# Patient Record
Sex: Male | Born: 1935
Health system: Southern US, Community
[De-identification: ages and names within clinical notes are randomized; demographics above are authoritative.]

## PROBLEM LIST (undated history)

## (undated) DIAGNOSIS — C61 Malignant neoplasm of prostate: Secondary | ICD-10-CM

## (undated) HISTORY — PX: APPENDECTOMY: SHX54

## (undated) HISTORY — PX: HERNIA REPAIR: SHX51

## (undated) HISTORY — PX: RADIOACTIVE SEED IMPLANT: SHX5150

## (undated) HISTORY — PX: CORONARY ARTERY BYPASS GRAFT: SHX141

---

## 2006-11-25 ENCOUNTER — Encounter: Admission: RE | Admit: 2006-11-25 | Discharge: 2006-12-02 | Payer: Self-pay | Admitting: Sports Medicine

## 2007-09-06 ENCOUNTER — Emergency Department (HOSPITAL_COMMUNITY): Admission: EM | Admit: 2007-09-06 | Discharge: 2007-09-06 | Payer: Self-pay | Admitting: Family Medicine

## 2007-12-01 ENCOUNTER — Emergency Department (HOSPITAL_COMMUNITY): Admission: EM | Admit: 2007-12-01 | Discharge: 2007-12-01 | Payer: Self-pay | Admitting: Emergency Medicine

## 2008-04-16 ENCOUNTER — Emergency Department (HOSPITAL_COMMUNITY): Admission: EM | Admit: 2008-04-16 | Discharge: 2008-04-16 | Payer: Self-pay | Admitting: Emergency Medicine

## 2011-05-31 ENCOUNTER — Encounter (HOSPITAL_COMMUNITY): Payer: Self-pay | Admitting: Emergency Medicine

## 2011-05-31 ENCOUNTER — Emergency Department (HOSPITAL_COMMUNITY)
Admission: EM | Admit: 2011-05-31 | Discharge: 2011-05-31 | Disposition: A | Discharge status: Home / Self Care | Payer: Medicare Other | Attending: Emergency Medicine | Admitting: Emergency Medicine

## 2011-05-31 DIAGNOSIS — Z8546 Personal history of malignant neoplasm of prostate: Secondary | ICD-10-CM | POA: Insufficient documentation

## 2011-05-31 DIAGNOSIS — K5289 Other specified noninfective gastroenteritis and colitis: Secondary | ICD-10-CM | POA: Insufficient documentation

## 2011-05-31 DIAGNOSIS — K529 Noninfective gastroenteritis and colitis, unspecified: Secondary | ICD-10-CM

## 2011-05-31 HISTORY — DX: Malignant neoplasm of prostate: C61

## 2011-05-31 LAB — COMPREHENSIVE METABOLIC PANEL
Albumin: 5.1 g/dL (ref 3.5–5.2)
Alkaline Phosphatase: 74 U/L (ref 39–117)
BUN: 18 mg/dL (ref 6–23)
CO2: 29 mEq/L (ref 19–32)
Chloride: 96 mEq/L (ref 96–112)
GFR calc Af Amer: 58 mL/min — ABNORMAL LOW (ref >90)
GFR calc non Af Amer: 50 mL/min — ABNORMAL LOW (ref >90)
Glucose, Bld: 168 mg/dL — ABNORMAL HIGH (ref 70–99)
Potassium: 4.4 mEq/L (ref 3.5–5.1)
Total Bilirubin: 0.9 mg/dL (ref 0.3–1.2)

## 2011-05-31 LAB — LIPASE, BLOOD: Lipase: 48 U/L (ref 11–59)

## 2011-05-31 LAB — DIFFERENTIAL
Lymphocytes Relative: 2 % — ABNORMAL LOW (ref 12–46)
Lymphs Abs: 0.3 10*3/uL — ABNORMAL LOW (ref 0.7–4.0)
Monocytes Absolute: 0.7 10*3/uL (ref 0.1–1.0)
Monocytes Relative: 4 % (ref 3–12)
Neutro Abs: 14.7 10*3/uL — ABNORMAL HIGH (ref 1.7–7.7)
Neutrophils Relative %: 93 % — ABNORMAL HIGH (ref 43–77)

## 2011-05-31 LAB — CBC
HCT: 51.3 % (ref 39.0–52.0)
Hemoglobin: 18.2 g/dL — ABNORMAL HIGH (ref 13.0–17.0)
RBC: 5.65 MIL/uL (ref 4.22–5.81)

## 2011-05-31 MED ORDER — ONDANSETRON 8 MG PO TBDP: 8.0000 mg | ORAL_TABLET | Freq: Three times a day (TID) | ORAL | Status: AC | PRN

## 2011-05-31 MED ORDER — SODIUM CHLORIDE 0.9 % IV BOLUS (SEPSIS)
1000.0000 mL | Freq: Once | INTRAVENOUS | Status: AC
  Administered 2011-05-31: 1000 mL via INTRAVENOUS

## 2011-05-31 MED ORDER — ONDANSETRON HCL 4 MG/2ML IJ SOLN
4.0000 mg | Freq: Once | INTRAMUSCULAR | Status: AC
  Administered 2011-05-31: 4 mg via INTRAVENOUS
  Filled 2011-05-31: qty 2

## 2011-05-31 MED ORDER — MORPHINE SULFATE 2 MG/ML IJ SOLN
2.0000 mg | Freq: Once | INTRAMUSCULAR | Status: AC
  Administered 2011-05-31: 2 mg via INTRAVENOUS
  Filled 2011-05-31: qty 1

## 2011-05-31 MED ORDER — DIPHENOXYLATE-ATROPINE 2.5-0.025 MG PO TABS
2.0000 | ORAL_TABLET | Freq: Once | ORAL | Status: AC
  Administered 2011-05-31: 2 via ORAL
  Filled 2011-05-31: qty 2

## 2011-05-31 MED ORDER — SODIUM CHLORIDE 0.9 % IV SOLN
Freq: Once | INTRAVENOUS | Status: AC
  Administered 2011-05-31: 05:00:00 via INTRAVENOUS

## 2011-05-31 MED ORDER — DIPHENOXYLATE-ATROPINE 2.5-0.025 MG PO TABS: 1.0000 | ORAL_TABLET | Freq: Four times a day (QID) | ORAL | Status: AC | PRN

## 2011-05-31 NOTE — ED Provider Notes (Addendum)
History     CSN: [id number]  Arrival date & time 05/31/11  0350   First MD Initiated Contact with Patient 05/31/11 0410      Chief Complaint  Patient presents with  . N/V/D     (Consider location/radiation/quality/duration/timing/severity/associated sxs/prior treatment) The history is provided by the patient.   Patient here with nonbilious vomiting and watery diarrhea after possible bad food exposure. Abdominal cramping notable chills. No fever or urinary symptoms. Patient denies being lightheaded or dizzy when standing. Nothing makes her symptoms better or worse. No medications used prior to arrival Past Medical History  Diagnosis Date  . Prostate cancer     Past Surgical History  Procedure Date  . Appendectomy   . Hernia repair     History reviewed. No pertinent family history.  History  Substance Use Topics  . Smoking status: Never Smoker   . Smokeless tobacco: Not on file  . Alcohol Use: Yes      Review of Systems  All other systems reviewed and are negative.    Allergies  Penicillins  Home Medications  No current outpatient prescriptions on file.  BP 142/72  Pulse 92  Resp 16  Wt 170 lb (77.111 kg)  SpO2 98%  Physical Exam  Nursing note and vitals reviewed. Constitutional: He is oriented to person, place, and time. Vital signs are normal. He appears well-developed and well-nourished.  Non-toxic appearance. No distress.  HENT:  Head: Normocephalic and atraumatic.  Eyes: Conjunctivae, EOM and lids are normal. Pupils are equal, round, and reactive to light.  Neck: Normal range of motion. Neck supple. No tracheal deviation present. No mass present.  Cardiovascular: Normal rate, regular rhythm and normal heart sounds.  Exam reveals no gallop.   No murmur heard. Pulmonary/Chest: Effort normal and breath sounds normal. No stridor. No respiratory distress. He has no decreased breath sounds. He has no wheezes. He has no rhonchi. He has no rales.    Abdominal: Soft. Normal appearance and bowel sounds are normal. He exhibits no distension. There is no tenderness. There is no rebound and no CVA tenderness.  Musculoskeletal: Normal range of motion. He exhibits no edema and no tenderness.  Neurological: He is alert and oriented to person, place, and time. He has normal strength. No cranial nerve deficit or sensory deficit. GCS eye subscore is 4. GCS verbal subscore is 5. GCS motor subscore is 6.  Skin: Skin is warm and dry. No abrasion and no rash noted.  Psychiatric: He has a normal mood and affect. His speech is normal and behavior is normal.    ED Course  Procedures (including critical care time)  Labs Reviewed - No data to display No results found.   No diagnosis found.    MDM  Patient given IV fluids and pain medication. He feels better 5:48 AM Repeat abdominal exam remains negative for surgical process. Suspect the patient has food poisoning he'll be discharged home        Toy Baker, MD 05/31/11 4098  Toy Baker, MD 06/04/11 [phone]

## 2011-05-31 NOTE — Discharge Instructions (Signed)
Viral Gastroenteritis Viral gastroenteritis is also known as stomach flu. This condition affects the stomach and intestinal tract. It can cause sudden diarrhea and vomiting. The illness typically lasts 3 to 8 days. Most people develop an immune response that eventually gets rid of the virus. While this natural response develops, the virus can make you quite ill. CAUSES  Many different viruses can cause gastroenteritis, such as rotavirus or noroviruses. You can catch one of these viruses by consuming contaminated food or water. You may also catch a virus by sharing utensils or other personal items with an infected person or by touching a contaminated surface. SYMPTOMS  The most common symptoms are diarrhea and vomiting. These problems can cause a severe loss of body fluids (dehydration) and a body salt (electrolyte) imbalance. Other symptoms may include:  Fever.   Headache.   Fatigue.   Abdominal pain.  DIAGNOSIS  Your caregiver can usually diagnose viral gastroenteritis based on your symptoms and a physical exam. A stool sample may also be taken to test for the presence of viruses or other infections. TREATMENT  This illness typically goes away on its own. Treatments are aimed at rehydration. The most serious cases of viral gastroenteritis involve vomiting so severely that you are not able to keep fluids down. In these cases, fluids must be given through an intravenous line (IV). HOME CARE INSTRUCTIONS   Drink enough fluids to keep your urine clear or pale yellow. Drink small amounts of fluids frequently and increase the amounts as tolerated.   Ask your caregiver for specific rehydration instructions.   Avoid:   Foods high in sugar.   Alcohol.   Carbonated drinks.   Tobacco.   Juice.   Caffeine drinks.   Extremely hot or cold fluids.   Fatty, greasy foods.   Too much intake of anything at one time.   Dairy products until 24 to 48 hours after diarrhea stops.   You may  consume probiotics. Probiotics are active cultures of beneficial bacteria. They may lessen the amount and number of diarrheal stools in adults. Probiotics can be found in yogurt with active cultures and in supplements.   Wash your hands well to avoid spreading the virus.   Only take over-the-counter or prescription medicines for pain, discomfort, or fever as directed by your caregiver. Do not give aspirin to children. Antidiarrheal medicines are not recommended.   Ask your caregiver if you should continue to take your regular prescribed and over-the-counter medicines.   Keep all follow-up appointments as directed by your caregiver.  SEEK IMMEDIATE MEDICAL CARE IF:   You are unable to keep fluids down.   You do not urinate at least once every 6 to 8 hours.   You develop shortness of breath.   You notice blood in your stool or vomit. This may look like coffee grounds.   You have abdominal pain that increases or is concentrated in one small area (localized).   You have persistent vomiting or diarrhea.   You have a fever.   The patient is a child younger than 3 months, and he or she has a fever.   The patient is a child older than 3 months, and he or she has a fever and persistent symptoms.   The patient is a child older than 3 months, and he or she has a fever and symptoms suddenly get worse.   The patient is a baby, and he or she has no tears when crying.  MAKE SURE YOU:  Understand these instructions.   Will watch your condition.   Will get help right away if you are not doing well or get worse.  Document Released: 02/11/2005 Document Revised: 01/31/2011 Document Reviewed: 11/28/2010 [facility] Patient Information 2012 [place], [place].

## 2011-05-31 NOTE — ED Notes (Signed)
Pt presented to the ER c/o N/V/D, states that sx started this evening about 2230, x4-5 vomiting and x3-4 diarrhea. Pt also reporst some abd pain and discomfort. Pt alert and oriented.

## 2011-05-31 NOTE — ED Notes (Signed)
Pt states he ate spaghetti for dinner and has been sick since.

## 2011-06-27 ENCOUNTER — Encounter (HOSPITAL_COMMUNITY): Payer: Self-pay | Admitting: Emergency Medicine

## 2011-06-27 ENCOUNTER — Emergency Department (HOSPITAL_COMMUNITY)
Admission: EM | Admit: 2011-06-27 | Discharge: 2011-06-28 | Disposition: A | Discharge status: Home / Self Care | Payer: Medicare Other | Attending: Emergency Medicine | Admitting: Emergency Medicine

## 2011-06-27 DIAGNOSIS — R5381 Other malaise: Secondary | ICD-10-CM | POA: Insufficient documentation

## 2011-06-27 DIAGNOSIS — R5383 Other fatigue: Secondary | ICD-10-CM | POA: Insufficient documentation

## 2011-06-27 DIAGNOSIS — S90569A Insect bite (nonvenomous), unspecified ankle, initial encounter: Secondary | ICD-10-CM | POA: Insufficient documentation

## 2011-06-27 DIAGNOSIS — W57XXXA Bitten or stung by nonvenomous insect and other nonvenomous arthropods, initial encounter: Secondary | ICD-10-CM

## 2011-06-27 NOTE — ED Provider Notes (Signed)
History     CSN: [id number]  Arrival date & time 06/27/11  2145   First MD Initiated Contact with Patient 06/27/11 2332      Chief Complaint  Patient presents with  . Foreign Body in Skin    (Consider location/radiation/quality/duration/timing/severity/associated sxs/prior treatment) HPI  77 year old male presents with chief complaints of tick bite. States he is [place] in the woods all the time.  He felt tired today and has slept most of the day.  When he went to shower this PM he notice a tick on his L anterior shin.  He did remove it and put it in a bag but felt that he may have not removed the tick entirely.  He denies significant pain, fever, headache, or rash.  Denies n/v/d.    Past Medical History  Diagnosis Date  . Prostate cancer     Past Surgical History  Procedure Date  . Appendectomy   . Hernia repair     History reviewed. No pertinent family history.  History  Substance Use Topics  . Smoking status: Never Smoker   . Smokeless tobacco: Not on file  . Alcohol Use: Yes      Review of Systems  Constitutional: Positive for fatigue. Negative for fever.  Musculoskeletal: Negative for joint swelling.  Skin: Negative for rash.    Allergies  Penicillins and Amoxicillin sodium  Home Medications   Current Outpatient Rx  Name Route Sig Dispense Refill  . STUDY MEDICATION  [facility] blind study. Patient could be taking 83 mg aspirin or placebo.      BP 125/55  Pulse 68  Temp(Src) 97.4 F (36.3 C) (Oral)  Resp 16  SpO2 98%  Physical Exam  Nursing note and vitals reviewed. Constitutional: He appears well-developed and well-nourished.  HENT:  Head: Atraumatic.  Eyes: Conjunctivae are normal.  Neck: Neck supple.  Pulmonary/Chest: Effort normal.  Musculoskeletal: Normal range of motion. He exhibits no edema and no tenderness.  Neurological: He is alert.  Skin: Skin is warm.       2mm skin ulceration noted to mid anterior tibia on L leg, no fb seen or  palpated.  No rash.      ED Course  Procedures (including critical care time)  Labs Reviewed - No data to display No results found.   No diagnosis found.    MDM  Tick bite to L mid anterior shin.  Tick removed. No retained body parts. No rash.  Unknown how long tick was on skin.  Full inspection of the skin reveals no other tick.  Reassurance given.  Will prescribe doxycycline for pt to use if develop fever, headache, or rash.  Pt voice understanding.          Fayrene Helper, PA-C 06/28/11 0028

## 2011-06-27 NOTE — ED Notes (Signed)
Patient states that he pulled a tick out of his leg. Sample with him in baggie

## 2011-06-28 MED ORDER — DOXYCYCLINE HYCLATE 100 MG PO CAPS: 100.0000 mg | ORAL_CAPSULE | Freq: Two times a day (BID) | ORAL | Status: AC

## 2011-06-28 NOTE — Discharge Instructions (Signed)
Wood Tick Bite Ticks are insects that attach themselves to the skin. Most tick bites are harmless, but sometimes ticks carry diseases that can make a person quite ill. The chance of getting ill depends on:  The kind of tick that bites you.   Time of year.   How long the tick is attached.   Geographic location.  Wood ticks are also called dog ticks. They are generally black. They can have white markings. They live in shrubs and grassy areas. They are larger than deer ticks. Wood ticks are about the size of a watermelon seed. They have a hard body. The most common places for ticks to attach themselves are the scalp, neck, armpits, waist, and groin. Wood ticks may stay attached for up to 2 weeks. TICKS MUST BE REMOVED AS SOON AS POSSIBLE TO HELP PREVENT DISEASES CAUSED BY TICK BITES.  TO REMOVE A TICK: 1. If available, put on latex gloves before trying to remove a tick.  2. Grasp the tick as close to the skin as possible, with curved forceps, fine tweezers or a special tick removal tool.  3. Pull gently with steady pressure until the tick lets go. Do not twist the tick or jerk it suddenly. This may break off the tick's head or mouth parts.  4. Do not crush the tick's body. This could force disease-carrying fluids from the tick into your body.  5. After the tick is removed, wash the bite area and your hands with soap and water or other disinfectant.  6. Apply a small amount of antiseptic cream or ointment to the bite site.  7. Wash and disinfect any instruments that were used.  8. Save the tick in a jar or plastic bag for later identification. Preserve the tick with a bit of alcohol or put it in the freezer.  9. Do not apply a hot match, petroleum jelly, or fingernail polish to the tick. This does not work and may increase the chances of disease from the tick bite.  YOU MAY NEED TO SEE YOUR CAREGIVER FOR A TETANUS SHOT NOW IF:  You have no idea when you had the last one.   You have never had a  tetanus shot before.  If you need a tetanus shot, and you decide not to get one, there is a rare chance of getting tetanus. Sickness from tetanus can be serious. If you get a tetanus shot, your arm may swell, get red and warm to the touch at the shot site. This is common and not a problem. PREVENTION  Wear protective clothing. Long sleeves and pants are best.   Wear white clothes to see ticks more easily   Tuck your pant legs into your socks.   If walking on trail, stay in the middle of the trail to avoid brushing against bushes.   Put insect repellent on all exposed skin and along boot tops, pant legs and sleeve cuffs   Check clothing, hair and skin repeatedly and before coming inside.   Brush off any ticks that are not attached.  SEEK MEDICAL CARE IF:   You cannot remove a tick or part of the tick that is left in the skin.   Unexplained fever.   Redness and swelling in the area of the tick bite.   Tender, swollen lymph glands.   Diarrhea.   Weight loss.   Cough.   Fatigue.   Muscle, joint or bone pain.   Belly pain.   Headache.   Rash.    SEEK IMMEDIATE MEDICAL CARE IF:   You develop an oral temperature above 102 F (38.9 C).   You are having trouble walking or moving your legs.   Numbness in the legs.   Shortness of breath.   Confusion.   Repeated vomiting.  Document Released: 02/09/2000 Document Revised: 01/31/2011 Document Reviewed: 01/18/2008 [employer] Patient Information 2012 [employer]. 

## 2011-06-28 NOTE — ED Provider Notes (Signed)
Medical screening examination/treatment/procedure(s) were performed by non-physician practitioner and as supervising physician I was immediately available for consultation/collaboration.  Olivia Mackie, MD 06/28/11 [phone]

## 2011-06-28 NOTE — ED Notes (Signed)
Patient ambulatory with a steady gait. Respirations equal and unlabored. Skin warm and dry. No acute distress noted. 

## 2012-05-21 ENCOUNTER — Encounter (HOSPITAL_COMMUNITY)
Admission: RE | Admit: 2012-05-21 | Discharge: 2012-05-21 | Disposition: A | Discharge status: Home / Self Care | Payer: Medicare Other | Source: Ambulatory Visit | Attending: Cardiology | Admitting: Cardiology

## 2012-05-21 DIAGNOSIS — Z5189 Encounter for other specified aftercare: Secondary | ICD-10-CM | POA: Insufficient documentation

## 2012-05-21 DIAGNOSIS — Z951 Presence of aortocoronary bypass graft: Secondary | ICD-10-CM | POA: Insufficient documentation

## 2012-05-21 NOTE — Progress Notes (Signed)
Cardiac Rehab Medication Review by a Pharmacist  Does the patient  feel that his/her medications are working for him/her?  yes  Has the patient been experiencing any side effects to the medications prescribed?  no  Does the patient measure his/her own blood pressure or blood glucose at home?  yes   Does the patient have any problems obtaining medications due to transportation or finances?   no  Understanding of regimen: excellent Understanding of indications: excellent Potential of compliance: excellent    Pharmacist comments: none    Laurence Slate 05/21/2012 8:36 AM

## 2012-05-25 ENCOUNTER — Encounter (HOSPITAL_COMMUNITY)
Admission: RE | Admit: 2012-05-25 | Discharge: 2012-05-25 | Disposition: A | Discharge status: Home / Self Care | Payer: Medicare Other | Source: Ambulatory Visit | Attending: Cardiology | Admitting: Cardiology

## 2012-05-25 NOTE — Progress Notes (Signed)
Pt in for his first cardiac rehab exercise class at 11:15 a.m  Pt tolerated light exercise with no complaints.  Monitor showed SR with no ectopy noted.  Continue to montior.

## 2012-05-27 ENCOUNTER — Encounter (HOSPITAL_COMMUNITY): Admission: RE | Admit: 2012-05-27 | Payer: Medicare Other | Source: Ambulatory Visit

## 2012-05-29 ENCOUNTER — Encounter (HOSPITAL_COMMUNITY): Payer: Medicare Other

## 2012-06-01 ENCOUNTER — Encounter (HOSPITAL_COMMUNITY): Payer: Medicare Other

## 2012-06-03 ENCOUNTER — Encounter (HOSPITAL_COMMUNITY): Payer: Medicare Other

## 2012-06-05 ENCOUNTER — Encounter (HOSPITAL_COMMUNITY): Payer: Medicare Other

## 2012-06-08 ENCOUNTER — Encounter (HOSPITAL_COMMUNITY): Payer: Medicare Other

## 2012-06-10 ENCOUNTER — Encounter (HOSPITAL_COMMUNITY): Payer: Medicare Other

## 2012-06-12 ENCOUNTER — Encounter (HOSPITAL_COMMUNITY): Payer: Medicare Other

## 2012-06-15 ENCOUNTER — Encounter (HOSPITAL_COMMUNITY): Payer: Medicare Other

## 2012-06-17 ENCOUNTER — Encounter (HOSPITAL_COMMUNITY): Payer: Medicare Other

## 2012-06-19 ENCOUNTER — Encounter (HOSPITAL_COMMUNITY): Payer: Medicare Other

## 2012-06-22 ENCOUNTER — Encounter (HOSPITAL_COMMUNITY): Payer: Medicare Other

## 2012-06-24 ENCOUNTER — Encounter (HOSPITAL_COMMUNITY): Payer: Medicare Other

## 2012-06-26 ENCOUNTER — Encounter (HOSPITAL_COMMUNITY): Payer: Medicare Other

## 2012-06-29 ENCOUNTER — Encounter (HOSPITAL_COMMUNITY): Payer: Medicare Other

## 2012-07-01 ENCOUNTER — Encounter (HOSPITAL_COMMUNITY): Payer: Medicare Other

## 2012-07-03 ENCOUNTER — Encounter (HOSPITAL_COMMUNITY): Payer: Medicare Other

## 2012-07-06 ENCOUNTER — Encounter (HOSPITAL_COMMUNITY): Payer: Medicare Other

## 2012-07-08 ENCOUNTER — Encounter (HOSPITAL_COMMUNITY): Payer: Medicare Other

## 2012-07-10 ENCOUNTER — Encounter (HOSPITAL_COMMUNITY): Payer: Medicare Other

## 2012-07-13 ENCOUNTER — Encounter (HOSPITAL_COMMUNITY): Payer: Medicare Other

## 2012-07-15 ENCOUNTER — Encounter (HOSPITAL_COMMUNITY): Payer: Medicare Other

## 2012-07-17 ENCOUNTER — Encounter (HOSPITAL_COMMUNITY): Payer: Medicare Other

## 2012-07-20 ENCOUNTER — Encounter (HOSPITAL_COMMUNITY): Payer: Medicare Other

## 2012-07-22 ENCOUNTER — Encounter (HOSPITAL_COMMUNITY): Payer: Medicare Other

## 2012-07-24 ENCOUNTER — Encounter (HOSPITAL_COMMUNITY): Payer: Medicare Other

## 2012-07-27 ENCOUNTER — Encounter (HOSPITAL_COMMUNITY): Payer: Medicare Other

## 2012-07-29 ENCOUNTER — Encounter (HOSPITAL_COMMUNITY): Payer: Medicare Other

## 2012-07-31 ENCOUNTER — Encounter (HOSPITAL_COMMUNITY): Payer: Medicare Other

## 2012-08-03 ENCOUNTER — Encounter (HOSPITAL_COMMUNITY): Payer: Medicare Other

## 2012-08-05 ENCOUNTER — Encounter (HOSPITAL_COMMUNITY): Payer: Medicare Other

## 2012-08-07 ENCOUNTER — Encounter (HOSPITAL_COMMUNITY): Payer: Medicare Other

## 2012-08-10 ENCOUNTER — Encounter (HOSPITAL_COMMUNITY): Payer: Medicare Other

## 2012-08-12 ENCOUNTER — Encounter (HOSPITAL_COMMUNITY): Payer: Medicare Other

## 2012-08-14 ENCOUNTER — Encounter (HOSPITAL_COMMUNITY): Payer: Medicare Other

## 2012-08-17 ENCOUNTER — Encounter (HOSPITAL_COMMUNITY): Payer: Medicare Other

## 2012-08-19 ENCOUNTER — Encounter (HOSPITAL_COMMUNITY): Payer: Medicare Other

## 2012-08-21 ENCOUNTER — Encounter (HOSPITAL_COMMUNITY): Payer: Medicare Other

## 2012-08-24 ENCOUNTER — Encounter (HOSPITAL_COMMUNITY): Payer: Medicare Other

## 2012-08-26 ENCOUNTER — Encounter (HOSPITAL_COMMUNITY): Payer: Medicare Other

## 2012-08-28 ENCOUNTER — Encounter (HOSPITAL_COMMUNITY): Payer: Medicare Other

## 2014-11-01 ENCOUNTER — Other Ambulatory Visit: Payer: Self-pay | Admitting: Student

## 2014-11-01 DIAGNOSIS — Z139 Encounter for screening, unspecified: Secondary | ICD-10-CM

## 2014-11-04 ENCOUNTER — Ambulatory Visit
Admission: RE | Admit: 2014-11-04 | Discharge: 2014-11-04 | Disposition: A | Discharge status: Home / Self Care | Payer: Non-veteran care | Source: Ambulatory Visit | Attending: Student | Admitting: Student

## 2014-11-04 DIAGNOSIS — Z139 Encounter for screening, unspecified: Secondary | ICD-10-CM

## 2019-06-26 HISTORY — DX: Other disorders of bilirubin metabolism: E80.6

## 2019-06-28 ENCOUNTER — Other Ambulatory Visit: Payer: Self-pay

## 2019-06-28 ENCOUNTER — Emergency Department (HOSPITAL_COMMUNITY): Payer: No Typology Code available for payment source

## 2019-06-28 ENCOUNTER — Observation Stay (HOSPITAL_COMMUNITY): Payer: No Typology Code available for payment source

## 2019-06-28 ENCOUNTER — Encounter (HOSPITAL_COMMUNITY): Payer: Self-pay

## 2019-06-28 ENCOUNTER — Inpatient Hospital Stay (HOSPITAL_COMMUNITY)
Admission: EM | Admit: 2019-06-28 | Discharge: 2019-07-02 | DRG: 436 | Disposition: A | Discharge status: Home / Self Care | Payer: No Typology Code available for payment source | Attending: Internal Medicine | Admitting: Internal Medicine

## 2019-06-28 DIAGNOSIS — Z923 Personal history of irradiation: Secondary | ICD-10-CM

## 2019-06-28 DIAGNOSIS — R7989 Other specified abnormal findings of blood chemistry: Secondary | ICD-10-CM

## 2019-06-28 DIAGNOSIS — Z86010 Personal history of colonic polyps: Secondary | ICD-10-CM

## 2019-06-28 DIAGNOSIS — R739 Hyperglycemia, unspecified: Secondary | ICD-10-CM | POA: Diagnosis present

## 2019-06-28 DIAGNOSIS — R911 Solitary pulmonary nodule: Secondary | ICD-10-CM | POA: Diagnosis present

## 2019-06-28 DIAGNOSIS — Z951 Presence of aortocoronary bypass graft: Secondary | ICD-10-CM

## 2019-06-28 DIAGNOSIS — Z20822 Contact with and (suspected) exposure to covid-19: Secondary | ICD-10-CM | POA: Diagnosis present

## 2019-06-28 DIAGNOSIS — C787 Secondary malignant neoplasm of liver and intrahepatic bile duct: Principal | ICD-10-CM | POA: Diagnosis present

## 2019-06-28 DIAGNOSIS — K769 Liver disease, unspecified: Secondary | ICD-10-CM | POA: Diagnosis present

## 2019-06-28 DIAGNOSIS — Z8546 Personal history of malignant neoplasm of prostate: Secondary | ICD-10-CM

## 2019-06-28 DIAGNOSIS — Z79899 Other long term (current) drug therapy: Secondary | ICD-10-CM

## 2019-06-28 DIAGNOSIS — I2510 Atherosclerotic heart disease of native coronary artery without angina pectoris: Secondary | ICD-10-CM

## 2019-06-28 DIAGNOSIS — R160 Hepatomegaly, not elsewhere classified: Secondary | ICD-10-CM

## 2019-06-28 DIAGNOSIS — E871 Hypo-osmolality and hyponatremia: Secondary | ICD-10-CM | POA: Diagnosis present

## 2019-06-28 DIAGNOSIS — G500 Trigeminal neuralgia: Secondary | ICD-10-CM | POA: Diagnosis present

## 2019-06-28 DIAGNOSIS — Z811 Family history of alcohol abuse and dependence: Secondary | ICD-10-CM

## 2019-06-28 DIAGNOSIS — Z880 Allergy status to penicillin: Secondary | ICD-10-CM

## 2019-06-28 DIAGNOSIS — K869 Disease of pancreas, unspecified: Secondary | ICD-10-CM | POA: Diagnosis present

## 2019-06-28 DIAGNOSIS — Z9049 Acquired absence of other specified parts of digestive tract: Secondary | ICD-10-CM

## 2019-06-28 DIAGNOSIS — Z801 Family history of malignant neoplasm of trachea, bronchus and lung: Secondary | ICD-10-CM

## 2019-06-28 DIAGNOSIS — K8689 Other specified diseases of pancreas: Secondary | ICD-10-CM

## 2019-06-28 DIAGNOSIS — Z800 Family history of malignant neoplasm of digestive organs: Secondary | ICD-10-CM

## 2019-06-28 HISTORY — DX: Atherosclerotic heart disease of native coronary artery without angina pectoris: I25.10

## 2019-06-28 LAB — COMPREHENSIVE METABOLIC PANEL
ALT: 96 U/L — ABNORMAL HIGH (ref 0–44)
AST: 144 U/L — ABNORMAL HIGH (ref 15–41)
Albumin: 3.1 g/dL — ABNORMAL LOW (ref 3.5–5.0)
Alkaline Phosphatase: 406 U/L — ABNORMAL HIGH (ref 38–126)
Anion gap: 10 (ref 5–15)
BUN: 12 mg/dL (ref 8–23)
CO2: 27 mmol/L (ref 22–32)
Calcium: 9.3 mg/dL (ref 8.9–10.3)
Chloride: 96 mmol/L — ABNORMAL LOW (ref 98–111)
Creatinine, Ser: 1.2 mg/dL (ref 0.61–1.24)
GFR calc Af Amer: >60 mL/min (ref >60)
GFR calc non Af Amer: 56 mL/min — ABNORMAL LOW (ref >60)
Glucose, Bld: 145 mg/dL — ABNORMAL HIGH (ref 70–99)
Potassium: 4.4 mmol/L (ref 3.5–5.1)
Sodium: 133 mmol/L — ABNORMAL LOW (ref 135–145)
Total Bilirubin: 5.7 mg/dL — ABNORMAL HIGH (ref 0.3–1.2)
Total Protein: 7.4 g/dL (ref 6.5–8.1)

## 2019-06-28 LAB — CBC
HCT: 44.6 % (ref 39.0–52.0)
Hemoglobin: 15 g/dL (ref 13.0–17.0)
MCH: 31.8 pg (ref 26.0–34.0)
MCHC: 33.6 g/dL (ref 30.0–36.0)
MCV: 94.5 fL (ref 80.0–100.0)
Platelets: 163 10*3/uL (ref 150–400)
RBC: 4.72 MIL/uL (ref 4.22–5.81)
RDW: 14.7 % (ref 11.5–15.5)
WBC: 8.5 10*3/uL (ref 4.0–10.5)
nRBC: 0 % (ref 0.0–0.2)

## 2019-06-28 LAB — URINALYSIS, ROUTINE W REFLEX MICROSCOPIC
Bacteria, UA: NONE SEEN
Glucose, UA: NEGATIVE mg/dL
Hgb urine dipstick: NEGATIVE
Ketones, ur: NEGATIVE mg/dL
Leukocytes,Ua: NEGATIVE
Nitrite: NEGATIVE
Protein, ur: 30 mg/dL — AB
Specific Gravity, Urine: 1.023 (ref 1.005–1.030)
pH: 5 (ref 5.0–8.0)

## 2019-06-28 LAB — RESPIRATORY PANEL BY RT PCR (FLU A&B, COVID)
Influenza A by PCR: NEGATIVE
Influenza B by PCR: NEGATIVE
SARS Coronavirus 2 by RT PCR: NEGATIVE

## 2019-06-28 LAB — LIPASE, BLOOD: Lipase: 38 U/L (ref 11–51)

## 2019-06-28 MED ORDER — ONDANSETRON HCL 4 MG/2ML IJ SOLN
4.0000 mg | Freq: Once | INTRAMUSCULAR | Status: AC
  Administered 2019-06-28: 4 mg via INTRAVENOUS
  Filled 2019-06-28: qty 2

## 2019-06-28 MED ORDER — ACETAMINOPHEN 500 MG PO TABS
1000.0000 mg | ORAL_TABLET | Freq: Once | ORAL | Status: AC
  Administered 2019-06-28: 1000 mg via ORAL
  Filled 2019-06-28: qty 2

## 2019-06-28 MED ORDER — ONDANSETRON HCL 4 MG PO TABS: 4.0000 mg | ORAL_TABLET | Freq: Four times a day (QID) | ORAL | Status: DC | PRN

## 2019-06-28 MED ORDER — IOHEXOL 300 MG/ML  SOLN
100.0000 mL | Freq: Once | INTRAMUSCULAR | Status: AC | PRN
  Administered 2019-06-28: 100 mL via INTRAVENOUS

## 2019-06-28 MED ORDER — MORPHINE SULFATE (PF) 2 MG/ML IV SOLN
2.0000 mg | Freq: Once | INTRAVENOUS | Status: AC
  Administered 2019-06-28: 2 mg via INTRAVENOUS
  Filled 2019-06-28: qty 1

## 2019-06-28 MED ORDER — ONDANSETRON HCL 4 MG/2ML IJ SOLN
4.0000 mg | Freq: Four times a day (QID) | INTRAMUSCULAR | Status: DC | PRN
  Administered 2019-06-29: 4 mg via INTRAVENOUS
  Filled 2019-06-28: qty 2

## 2019-06-28 MED ORDER — CHLORHEXIDINE GLUCONATE 0.12 % MT SOLN
15.0000 mL | Freq: Three times a day (TID) | OROMUCOSAL | Status: DC
  Administered 2019-06-29 – 2019-07-02 (×9): 15 mL via OROMUCOSAL
  Filled 2019-06-28 (×11): qty 15

## 2019-06-28 NOTE — ED Notes (Signed)
Pt transported to MRI via stretcher.  

## 2019-06-28 NOTE — ED Provider Notes (Signed)
[facility] DEPART[facility] Provider Note   CSN: [id number] Arrival date & time: 06/28/19  1603     History Chief Complaint  Patient presents with  . Abdominal Pain    Thomas Riddle is a 85 y.o. male.  85 yo M with a chief complaints of right quadrant abdominal pain.  This been going on for about 3 weeks now.  Seems to come and go.  Nothing seems to make it better or worse.  Not associated with eating.  He had had a recent dental extraction after finding out that he had a cavity on CT imaging for trigeminal neuralgia.  Feels that he has been under a lot of stress from all these things.  He has tried different things at home to try and improve his symptoms without improvement.  He denies fevers denies nausea vomiting denies diarrhea.  Feels that his urine is gotten darker in color and that his stools have gotten lighter in color.  Prior history of appendectomy.  The history is provided by the patient.  Abdominal Pain Pain location:  RUQ Pain quality: sharp and shooting   Pain radiates to:  Does not radiate Pain severity:  Moderate Onset quality:  Gradual Duration:  3 weeks Timing:  Intermittent Progression:  Waxing and waning Chronicity:  New Relieved by:  Nothing Worsened by:  Nothing Ineffective treatments:  None tried Associated symptoms: no chest pain, no chills, no diarrhea, no fever, no shortness of breath and no vomiting        Past Medical History:  Diagnosis Date  . Prostate cancer [facility])     Patient Active Problem List   Diagnosis Date Noted  . Hyperbilirubinemia 06/28/2019    Past Surgical History:  Procedure Laterality Date  . APPENDECTOMY    . HERNIA REPAIR         No family history on file.  Social History   Tobacco Use  . Smoking status: Never Smoker  Substance Use Topics  . Alcohol use: Yes  . Drug use: No    Home Medications Prior to Admission medications   Medication Sig Start Date End Date Taking? Authorizing  Provider  chlorhexidine (PERIDEX) 0.12 % solution Use as directed 15 mLs in the mouth or throat 3 (three) times daily.   Yes [provider]    Allergies    Amoxicillin sodium and Penicillins  Review of Systems   Review of Systems  Constitutional: Negative for chills and fever.  HENT: Negative for congestion and facial swelling.   Eyes: Negative for discharge and visual disturbance.  Respiratory: Negative for shortness of breath.   Cardiovascular: Negative for chest pain and palpitations.  Gastrointestinal: Positive for abdominal pain. Negative for diarrhea and vomiting.  Musculoskeletal: Negative for arthralgias and myalgias.  Skin: Negative for color change and rash.  Neurological: Negative for tremors, syncope and headaches.  Psychiatric/Behavioral: Negative for confusion and dysphoric mood.    Physical Exam Updated Vital Signs BP (!) 161/83   Pulse 86   Temp 98.3 F (36.8 C) (Oral)   Resp (!) 23   SpO2 96%   Physical Exam Vitals and nursing note reviewed.  Constitutional:      Appearance: He is well-developed.  HENT:     Head: Normocephalic and atraumatic.  Eyes:     Pupils: Pupils are equal, round, and reactive to light.  Neck:     Vascular: No JVD.  Cardiovascular:     Rate and Rhythm: Normal rate and regular rhythm.  Heart sounds: No murmur. No friction rub. No gallop.   Pulmonary:     Effort: No respiratory distress.     Breath sounds: No wheezing.  Abdominal:     General: There is no distension.     Tenderness: There is no abdominal tenderness. There is no guarding or rebound.     Comments: Benign abdominal exam  Musculoskeletal:        General: Normal range of motion.     Cervical back: Normal range of motion and neck supple.  Skin:    Coloration: Skin is not pale.     Findings: No rash.  Neurological:     Mental Status: He is alert and oriented to person, place, and time.  Psychiatric:        Behavior: Behavior normal.     ED  Results / Procedures / Treatments   Labs (all labs ordered are listed, but only abnormal results are displayed) Labs Reviewed  COMPREHENSIVE METABOLIC PANEL - Abnormal; Notable for the following components:      Result Value   Sodium 133 (*)    Chloride 96 (*)    Glucose, Bld 145 (*)    Albumin 3.1 (*)    AST 144 (*)    ALT 96 (*)    Alkaline Phosphatase 406 (*)    Total Bilirubin 5.7 (*)    GFR calc non Af Amer 56 (*)    All other components within normal limits  URINALYSIS, ROUTINE W REFLEX MICROSCOPIC - Abnormal; Notable for the following components:   Color, Urine AMBER (*)    Bilirubin Urine SMALL (*)    Protein, ur 30 (*)    All other components within normal limits  RESPIRATORY PANEL BY RT PCR (FLU A&B, COVID)  LIPASE, BLOOD  CBC    EKG EKG Interpretation  Date/Time:  Monday Jun 28 2019 16:34:13 EDT Ventricular Rate:  92 PR Interval:  150 QRS Duration: 92 QT Interval:  354 QTC Calculation: 437 R Axis:   101 Text Interpretation: Sinus rhythm with Premature atrial complexes Rightward axis Borderline ECG No old tracing to compare Confirmed by Deno Etienne [phone]) on 06/28/2019 7:36:36 PM   Radiology DG Chest 2 View  Result Date: 06/28/2019 CLINICAL DATA:  Shortness of breath EXAM: CHEST - 2 VIEW COMPARISON:  None. FINDINGS: Post sternotomy changes. No acute consolidation or effusion. Normal heart size. Aortic atherosclerosis. No pneumothorax. IMPRESSION: No active cardiopulmonary disease. Electronically Signed   By: Donavan Foil M.D.   On: 06/28/2019 17:23   CT Chest W Contrast  Result Date: 06/28/2019 CLINICAL DATA:  Right upper quadrant pain EXAM: CT CHEST, ABDOMEN, AND PELVIS WITH CONTRAST TECHNIQUE: Multidetector CT imaging of the chest, abdomen and pelvis was performed following the standard protocol during bolus administration of intravenous contrast. CONTRAST:  158mL OMNIPAQUE IOHEXOL 300 MG/ML  SOLN COMPARISON:  None. FINDINGS: CT CHEST FINDINGS Cardiovascular:  Prior CABG. Heart is normal size. Aorta is normal caliber. Aortic atherosclerosis. Mediastinum/Nodes: Numerous small and borderline sized mediastinal lymph nodes. Pretracheal lymph node has a short axis diameter of 9 mm. Other similarly sized or smaller mediastinal lymph nodes. No hilar or axillary adenopathy. Lungs/Pleura: Biapical scarring. 5 mm nodule in the left lower lobe. Linear atelectasis or scarring at the left base. No effusions. Musculoskeletal: Chest wall soft tissues are unremarkable. No acute bony abnormality. CT ABDOMEN PELVIS FINDINGS Hepatobiliary: Innumerable low-density lesions throughout the liver most compatible with metastases. Index right hepatic lesion posteriorly measures 2.5 cm. The other innumerable lesions  are similar or smaller in size. Gallbladder grossly unremarkable. Pancreas: No focal abnormality or ductal dilatation. Spleen: No focal abnormality.  Normal size. Adrenals/Urinary Tract: No adrenal abnormality. No focal renal abnormality. No stones or hydronephrosis. Urinary bladder is unremarkable. Stomach/Bowel: Stomach, large and small bowel grossly unremarkable. Vascular/Lymphatic: Diffuse aortic atherosclerosis. No evidence of aneurysm or adenopathy. Reproductive: Radiation seeds in the region of the prostate. Other: Trace free fluid in the cul-de-sac.  No free air. Musculoskeletal: No acute bony abnormality. IMPRESSION: Innumerable low-density lesions throughout the liver compatible with metastases. Nonspecific borderline mediastinal lymph nodes, 9 mm or less in size. 5 mm left lower lobe pulmonary nodule. Biapical scarring.  Left base scarring or atelectasis. Aortic atherosclerosis. Electronically Signed   By: Rolm Baptise M.D.   On: 06/28/2019 21:32   CT ABDOMEN PELVIS W CONTRAST  Result Date: 06/28/2019 CLINICAL DATA:  Right upper quadrant pain EXAM: CT CHEST, ABDOMEN, AND PELVIS WITH CONTRAST TECHNIQUE: Multidetector CT imaging of the chest, abdomen and pelvis was performed  following the standard protocol during bolus administration of intravenous contrast. CONTRAST:  161mL OMNIPAQUE IOHEXOL 300 MG/ML  SOLN COMPARISON:  None. FINDINGS: CT CHEST FINDINGS Cardiovascular: Prior CABG. Heart is normal size. Aorta is normal caliber. Aortic atherosclerosis. Mediastinum/Nodes: Numerous small and borderline sized mediastinal lymph nodes. Pretracheal lymph node has a short axis diameter of 9 mm. Other similarly sized or smaller mediastinal lymph nodes. No hilar or axillary adenopathy. Lungs/Pleura: Biapical scarring. 5 mm nodule in the left lower lobe. Linear atelectasis or scarring at the left base. No effusions. Musculoskeletal: Chest wall soft tissues are unremarkable. No acute bony abnormality. CT ABDOMEN PELVIS FINDINGS Hepatobiliary: Innumerable low-density lesions throughout the liver most compatible with metastases. Index right hepatic lesion posteriorly measures 2.5 cm. The other innumerable lesions are similar or smaller in size. Gallbladder grossly unremarkable. Pancreas: No focal abnormality or ductal dilatation. Spleen: No focal abnormality.  Normal size. Adrenals/Urinary Tract: No adrenal abnormality. No focal renal abnormality. No stones or hydronephrosis. Urinary bladder is unremarkable. Stomach/Bowel: Stomach, large and small bowel grossly unremarkable. Vascular/Lymphatic: Diffuse aortic atherosclerosis. No evidence of aneurysm or adenopathy. Reproductive: Radiation seeds in the region of the prostate. Other: Trace free fluid in the cul-de-sac.  No free air. Musculoskeletal: No acute bony abnormality. IMPRESSION: Innumerable low-density lesions throughout the liver compatible with metastases. Nonspecific borderline mediastinal lymph nodes, 9 mm or less in size. 5 mm left lower lobe pulmonary nodule. Biapical scarring.  Left base scarring or atelectasis. Aortic atherosclerosis. Electronically Signed   By: Rolm Baptise M.D.   On: 06/28/2019 21:32    Procedures Procedures  (including critical care time)  Medications Ordered in ED Medications  iohexol (OMNIPAQUE) 300 MG/ML solution 100 mL (100 mLs Intravenous Contrast Given 06/28/19 2022)  morphine 2 MG/ML injection 2 mg (2 mg Intravenous Given 06/28/19 2156)  ondansetron (ZOFRAN) injection 4 mg (4 mg Intravenous Given 06/28/19 2156)  acetaminophen (TYLENOL) tablet 1,000 mg (1,000 mg Oral Given 06/28/19 2156)    ED Course  I have reviewed the triage vital signs and the nursing notes.  Pertinent labs & imaging results that were available during my care of the patient were reviewed by me and considered in my medical decision making (see chart for details).    MDM Rules/Calculators/A&P                      85 yo M with a chief complaints of right upper quadrant abdominal pain.  This been going on for  about 3 weeks now.  Tends to wake him up in the middle the night and then will resolve spontaneously.  Nothing seems to make it better or worse.  Patient's LFTs are elevated total bili is just under 6.  We will obtain a CT scan.  CT with numerous lesions to the liver.  No obvious common bile duct obstruction.  No stones.  I discussed the case with Dr. Hilarie Fredrickson, [facility].  He recommended doing a MRCP.  They will evaluate the patient in the morning.  Discussed with hospitalist for admission.  The patients results and plan were reviewed and discussed.   Any x-rays performed were independently reviewed by myself.   Differential diagnosis were considered with the presenting HPI.  Medications  iohexol (OMNIPAQUE) 300 MG/ML solution 100 mL (100 mLs Intravenous Contrast Given 06/28/19 2022)  morphine 2 MG/ML injection 2 mg (2 mg Intravenous Given 06/28/19 2156)  ondansetron (ZOFRAN) injection 4 mg (4 mg Intravenous Given 06/28/19 2156)  acetaminophen (TYLENOL) tablet 1,000 mg (1,000 mg Oral Given 06/28/19 2156)    Vitals:   06/28/19 1925 06/28/19 2100 06/28/19 2130 06/28/19 2230  BP: (!) 158/81 (!) 165/84 (!)  169/92 (!) 161/83  Pulse: 88 90 83 86  Resp: (!) 21 16 (!) 21 (!) 23  Temp:      TempSrc:      SpO2: 98% 96% 98% 96%    Final diagnoses:  Hyperbilirubinemia  LFT elevation    Admission/ observation were discussed with the admitting physician, patient and/or family and they are comfortable with the plan.   Final Clinical Impression(s) / ED Diagnoses Final diagnoses:  Hyperbilirubinemia  LFT elevation    Rx / DC Orders ED Discharge Orders    None       Deno Etienne, DO 06/28/19 2241

## 2019-06-28 NOTE — H&P (Signed)
History and Physical    Thomas Riddle [id number] DOB: 1935/01/28 DOA: 06/28/2019  PCP: System, Pcp Not In   Patient coming from: Home.  I have personally briefly reviewed patient's old medical records in [place]  Chief Complaint: RUQ pain for 3 weeks.  HPI: Thomas Riddle is a 85 y.o. male with medical history significant of CAD/CABG, trigeminal neuralgia, prostate cancer with radioactive seed implant who is coming to the emergency department with complaints of RUQ pain for the past 3 weeks associated with light-colored stools and dark color urine.  Denies nausea, vomiting, diarrhea, constipation, melena or hematochezia.  No dysuria, frequency hematuria.  He denies fever, chills, weight loss or appetite changes, but feels fatigued.  No rhinorrhea, sore throat, dyspnea, wheezing or hemoptysis.  Denies CP, palpitations, dizziness, diaphoresis, PND, orthopnea or pitting edema of the lower extremities.  No polyuria, polydipsia, polyphagia or blurred vision.  ED Course: Initial vital signs temperature 98.3 F, respirations 18, pulse 93, BP 181/76 mmHg and O2 sat 95% on room air.  The patient was given 1000 mg of acetaminophen, ondansetron 4 mg and morphine 4 mg IVP.  Lab results: Urinalysis showed proteinuria 30 mg/dL and small bilirubinuria.  All other values were normal. CBC was normal with a white count of 8.5, hemoglobin of 15.0 g/dL and platelets 163.  Lipase was normal.  CMP showed a sodium of 133, potassium 4.4, chloride 96 and CO2 27 mmol/L.  Renal function and calcium were normal.  Glucose 145 and total bilirubin 5.7 mg/dL.  Total protein 7.4 and albumin 3.1 g/dL.  AST 144, ALT 96 and alkaline phosphatase 406 units/L.  Imaging: His chest radiograph did not show any active cardiopulmonary disease.  CT chest/abdomen with contrast showed numerous low-density lesions throughout the liver compatible with metastasis.  Nonspecific borderline mediastinal lymph nodes, 9 mm or less in size.  There  is a 5 mm left lower lobe pulmonary nodule.  By apical scaring.  Left base scaring or atelectasis.  Please see images and radiology reports in their entirety for further detail.  Review of Systems: As per HPI otherwise all other systems reviewed and are negative.  Past Medical History:  Diagnosis Date  . CAD (coronary artery disease) 06/28/2019  . Prostate cancer [facility])    Past Surgical History:  Procedure Laterality Date  . APPENDECTOMY    . CORONARY ARTERY BYPASS GRAFT    . HERNIA REPAIR    . RADIOACTIVE SEED IMPLANT N/A    Prostate cancer   Social History  reports that he has never smoked. He does not have any smokeless tobacco history on file. He reports current alcohol use. He reports that he does not use drugs.  Allergies  Allergen Reactions  . Amoxicillin Sodium Rash    Full body rash  . Penicillins Rash    amoxicillins    Social History  reports that he has never smoked. He does not have any smokeless tobacco history on file. He reports current alcohol use. He reports that he does not use drugs.  Family History  Problem Relation Age of Onset  . Alcohol abuse Father   . Colon cancer Sister   . Lung cancer Brother    Prior to Admission medications   Medication Sig Start Date End Date Taking? Authorizing Provider  chlorhexidine (PERIDEX) 0.12 % solution Use as directed 15 mLs in the mouth or throat 3 (three) times daily.   Yes [provider]   Physical Exam: Vitals:   06/28/19  2130 06/28/19 2230 06/28/19 2245 06/28/19 2300  BP: (!) 169/92 (!) 161/83 (!) 153/82 (!) 154/79  Pulse: 83 86 85 81  Resp: (!) 21 (!) 23  (!) 24  Temp:      TempSrc:      SpO2: 98% 96% 96% 95%   Constitutional: NAD, calm, comfortable Eyes: PERRL, lids and conjunctivae normal.  Positive scleral icterus. ENMT: Mucous membranes are moist.  Healing molar extraction.  Posterior pharynx clear of any exudate or lesions. Neck: normal, supple, no masses, no thyromegaly Respiratory: clear  to auscultation bilaterally, no wheezing, no crackles. Normal respiratory effort. No accessory muscle use.  Cardiovascular: Regular rate and rhythm, no murmurs / rubs / gallops. No extremity edema. 2+ pedal pulses. No carotid bruits.  Abdomen: Nondistended.  BS positive.  Soft, positive RUQ tenderness, no guarding or rebound, no masses palpated. No hepatosplenomegaly.  Musculoskeletal: no clubbing / cyanosis. Good ROM, no contractures. Normal muscle tone.  Skin: The skin is jaundiced. Neurologic: CN 2-12 grossly intact. Sensation intact, DTR normal. Strength 5/5 in all 4.  Psychiatric: Normal judgment and insight. Alert and oriented x 3. Normal mood.   Labs on Admission: I have personally reviewed following labs and imaging studies  CBC: Recent Labs  Lab 06/28/19 1633  WBC 8.5  HGB 15.0  HCT 44.6  MCV 94.5  PLT [id number]    Basic Metabolic Panel: Recent Labs  Lab 06/28/19 1633  NA 133*  K 4.4  CL 96*  CO2 27  GLUCOSE 145*  BUN 12  CREATININE 1.20  CALCIUM 9.3   GFR: CrCl cannot be calculated (Unknown ideal weight.).  Liver Function Tests: Recent Labs  Lab 06/28/19 1633  AST 144*  ALT 96*  ALKPHOS 406*  BILITOT 5.7*  PROT 7.4  ALBUMIN 3.1*   Urine analysis:    Component Value Date/Time   COLORURINE AMBER (A) 06/28/2019 1922   APPEARANCEUR CLEAR 06/28/2019 1922   LABSPEC 1.023 06/28/2019 1922   PHURINE 5.0 06/28/2019 1922   GLUCOSEU NEGATIVE 06/28/2019 [place] NEGATIVE 06/28/2019 1922   BILIRUBINUR SMALL (A) 06/28/2019 [place] NEGATIVE 06/28/2019 1922   PROTEINUR 30 (A) 06/28/2019 1922   NITRITE NEGATIVE 06/28/2019 1922   LEUKOCYTESUR NEGATIVE 06/28/2019 1922   Radiological Exams on Admission: DG Chest 2 View  Result Date: 06/28/2019 CLINICAL DATA:  Shortness of breath EXAM: CHEST - 2 VIEW COMPARISON:  None. FINDINGS: Post sternotomy changes. No acute consolidation or effusion. Normal heart size. Aortic atherosclerosis. No pneumothorax.  IMPRESSION: No active cardiopulmonary disease. Electronically Signed   By: Donavan Foil M.D.   On: 06/28/2019 17:23   CT Chest W Contrast  Result Date: 06/28/2019 CLINICAL DATA:  Right upper quadrant pain EXAM: CT CHEST, ABDOMEN, AND PELVIS WITH CONTRAST TECHNIQUE: Multidetector CT imaging of the chest, abdomen and pelvis was performed following the standard protocol during bolus administration of intravenous contrast. CONTRAST:  166mL OMNIPAQUE IOHEXOL 300 MG/ML  SOLN COMPARISON:  None. FINDINGS: CT CHEST FINDINGS Cardiovascular: Prior CABG. Heart is normal size. Aorta is normal caliber. Aortic atherosclerosis. Mediastinum/Nodes: Numerous small and borderline sized mediastinal lymph nodes. Pretracheal lymph node has a short axis diameter of 9 mm. Other similarly sized or smaller mediastinal lymph nodes. No hilar or axillary adenopathy. Lungs/Pleura: Biapical scarring. 5 mm nodule in the left lower lobe. Linear atelectasis or scarring at the left base. No effusions. Musculoskeletal: Chest wall soft tissues are unremarkable. No acute bony abnormality. CT ABDOMEN PELVIS FINDINGS Hepatobiliary: Innumerable low-density lesions throughout the  liver most compatible with metastases. Index right hepatic lesion posteriorly measures 2.5 cm. The other innumerable lesions are similar or smaller in size. Gallbladder grossly unremarkable. Pancreas: No focal abnormality or ductal dilatation. Spleen: No focal abnormality.  Normal size. Adrenals/Urinary Tract: No adrenal abnormality. No focal renal abnormality. No stones or hydronephrosis. Urinary bladder is unremarkable. Stomach/Bowel: Stomach, large and small bowel grossly unremarkable. Vascular/Lymphatic: Diffuse aortic atherosclerosis. No evidence of aneurysm or adenopathy. Reproductive: Radiation seeds in the region of the prostate. Other: Trace free fluid in the cul-de-sac.  No free air. Musculoskeletal: No acute bony abnormality. IMPRESSION: Innumerable low-density  lesions throughout the liver compatible with metastases. Nonspecific borderline mediastinal lymph nodes, 9 mm or less in size. 5 mm left lower lobe pulmonary nodule. Biapical scarring.  Left base scarring or atelectasis. Aortic atherosclerosis. Electronically Signed   By: Rolm Baptise M.D.   On: 06/28/2019 21:32   CT ABDOMEN PELVIS W CONTRAST  Result Date: 06/28/2019 CLINICAL DATA:  Right upper quadrant pain EXAM: CT CHEST, ABDOMEN, AND PELVIS WITH CONTRAST TECHNIQUE: Multidetector CT imaging of the chest, abdomen and pelvis was performed following the standard protocol during bolus administration of intravenous contrast. CONTRAST:  185mL OMNIPAQUE IOHEXOL 300 MG/ML  SOLN COMPARISON:  None. FINDINGS: CT CHEST FINDINGS Cardiovascular: Prior CABG. Heart is normal size. Aorta is normal caliber. Aortic atherosclerosis. Mediastinum/Nodes: Numerous small and borderline sized mediastinal lymph nodes. Pretracheal lymph node has a short axis diameter of 9 mm. Other similarly sized or smaller mediastinal lymph nodes. No hilar or axillary adenopathy. Lungs/Pleura: Biapical scarring. 5 mm nodule in the left lower lobe. Linear atelectasis or scarring at the left base. No effusions. Musculoskeletal: Chest wall soft tissues are unremarkable. No acute bony abnormality. CT ABDOMEN PELVIS FINDINGS Hepatobiliary: Innumerable low-density lesions throughout the liver most compatible with metastases. Index right hepatic lesion posteriorly measures 2.5 cm. The other innumerable lesions are similar or smaller in size. Gallbladder grossly unremarkable. Pancreas: No focal abnormality or ductal dilatation. Spleen: No focal abnormality.  Normal size. Adrenals/Urinary Tract: No adrenal abnormality. No focal renal abnormality. No stones or hydronephrosis. Urinary bladder is unremarkable. Stomach/Bowel: Stomach, large and small bowel grossly unremarkable. Vascular/Lymphatic: Diffuse aortic atherosclerosis. No evidence of aneurysm or  adenopathy. Reproductive: Radiation seeds in the region of the prostate. Other: Trace free fluid in the cul-de-sac.  No free air. Musculoskeletal: No acute bony abnormality. IMPRESSION: Innumerable low-density lesions throughout the liver compatible with metastases. Nonspecific borderline mediastinal lymph nodes, 9 mm or less in size. 5 mm left lower lobe pulmonary nodule. Biapical scarring.  Left base scarring or atelectasis. Aortic atherosclerosis. Electronically Signed   By: Rolm Baptise M.D.   On: 06/28/2019 21:32    EKG: Independently reviewed. Vent. rate 92 BPM PR interval 150 ms QRS duration 92 ms QT/QTc 354/437 ms P-R-T axes 87 101 70 Sinus rhythm with Premature atrial complexes Rightward axis Borderline ECG  Assessment/Plan Principal Problem:   Hyperbilirubinemia Observation/MedSurg. Keep n.p.o. Continue analgesics as needed. Continue antiemetics as needed. GI to evaluate.  Active Problems:   Liver lesions Malignancy is highly suspected. May need IR for possible lesion biopsy.    Hyperglycemia Follow-up fasting glucose.    CAD (coronary artery disease) Denies CP or dyspnea on exertion. Currently not taking any medications.     DVT prophylaxis: SCDs. Code Status:   Full code. Family Communication: Disposition Plan:   Patient is from:  Home.  Anticipated DC to:  Home.  Anticipated DC date:  06/30/2019.  Anticipated DC barriers: Clinical improvement  and GI evaluation. Consults called:  Gastroenterology (Dr. Hilarie Fredrickson). Admission status:  MedSurg/observation.    Severity of Illness: Moderate severity.  Reubin Milan MD [facility]  How to contact the [facility] Attending or Consulting provider [place] or covering provider during after hours [place], for this patient?   1. Check the care team in [facility] and look for a) attending/consulting [facility] provider listed and b) the [facility] team listed 2. Log into [email or web address] and use [place] universal password to  access. If you do not have the password, please contact the hospital operator. 3. Locate the [facility] provider you are looking for under [facility] and page to a number that you can be directly reached. 4. If you still have difficulty reaching the provider, please page the [facility] (Director on Call) for the Hospitalists listed on amion for assistance.  06/28/2019, 11:53 PM   This document was prepared using Dragon voice recognition software and may contain some unintended transcription errors.

## 2019-06-28 NOTE — ED Triage Notes (Signed)
Pt reports RUQ pain for the past 3 weeks, denies n/v/d. Also having dark colored urine and light colored stool. Pt hypertensive in triage 202/92 and 181/76, denies hx of same. Pt a.o, nad noted

## 2019-06-29 ENCOUNTER — Encounter (HOSPITAL_COMMUNITY): Payer: Self-pay | Admitting: Internal Medicine

## 2019-06-29 DIAGNOSIS — R911 Solitary pulmonary nodule: Secondary | ICD-10-CM | POA: Diagnosis present

## 2019-06-29 DIAGNOSIS — R17 Unspecified jaundice: Secondary | ICD-10-CM | POA: Insufficient documentation

## 2019-06-29 DIAGNOSIS — C787 Secondary malignant neoplasm of liver and intrahepatic bile duct: Secondary | ICD-10-CM | POA: Diagnosis present

## 2019-06-29 DIAGNOSIS — Z20822 Contact with and (suspected) exposure to covid-19: Secondary | ICD-10-CM | POA: Diagnosis present

## 2019-06-29 DIAGNOSIS — K769 Liver disease, unspecified: Secondary | ICD-10-CM | POA: Diagnosis not present

## 2019-06-29 DIAGNOSIS — R160 Hepatomegaly, not elsewhere classified: Secondary | ICD-10-CM | POA: Diagnosis not present

## 2019-06-29 DIAGNOSIS — K869 Disease of pancreas, unspecified: Secondary | ICD-10-CM | POA: Diagnosis present

## 2019-06-29 DIAGNOSIS — R739 Hyperglycemia, unspecified: Secondary | ICD-10-CM | POA: Diagnosis present

## 2019-06-29 DIAGNOSIS — Z951 Presence of aortocoronary bypass graft: Secondary | ICD-10-CM | POA: Diagnosis not present

## 2019-06-29 DIAGNOSIS — Z86010 Personal history of colonic polyps: Secondary | ICD-10-CM | POA: Diagnosis not present

## 2019-06-29 DIAGNOSIS — I2510 Atherosclerotic heart disease of native coronary artery without angina pectoris: Secondary | ICD-10-CM | POA: Diagnosis present

## 2019-06-29 DIAGNOSIS — Z9049 Acquired absence of other specified parts of digestive tract: Secondary | ICD-10-CM | POA: Diagnosis not present

## 2019-06-29 DIAGNOSIS — R7989 Other specified abnormal findings of blood chemistry: Secondary | ICD-10-CM | POA: Diagnosis not present

## 2019-06-29 DIAGNOSIS — Z79899 Other long term (current) drug therapy: Secondary | ICD-10-CM | POA: Diagnosis not present

## 2019-06-29 DIAGNOSIS — G500 Trigeminal neuralgia: Secondary | ICD-10-CM | POA: Diagnosis present

## 2019-06-29 DIAGNOSIS — Z800 Family history of malignant neoplasm of digestive organs: Secondary | ICD-10-CM | POA: Diagnosis not present

## 2019-06-29 DIAGNOSIS — K8689 Other specified diseases of pancreas: Secondary | ICD-10-CM | POA: Diagnosis not present

## 2019-06-29 DIAGNOSIS — Z801 Family history of malignant neoplasm of trachea, bronchus and lung: Secondary | ICD-10-CM | POA: Diagnosis not present

## 2019-06-29 DIAGNOSIS — Z923 Personal history of irradiation: Secondary | ICD-10-CM | POA: Diagnosis not present

## 2019-06-29 DIAGNOSIS — Z880 Allergy status to penicillin: Secondary | ICD-10-CM | POA: Diagnosis not present

## 2019-06-29 DIAGNOSIS — E871 Hypo-osmolality and hyponatremia: Secondary | ICD-10-CM | POA: Diagnosis present

## 2019-06-29 DIAGNOSIS — Z8546 Personal history of malignant neoplasm of prostate: Secondary | ICD-10-CM | POA: Diagnosis not present

## 2019-06-29 DIAGNOSIS — Z811 Family history of alcohol abuse and dependence: Secondary | ICD-10-CM | POA: Diagnosis not present

## 2019-06-29 LAB — CBC WITH DIFFERENTIAL/PLATELET
Abs Immature Granulocytes: 0.09 10*3/uL — ABNORMAL HIGH (ref 0.00–0.07)
Basophils Absolute: 0.1 10*3/uL (ref 0.0–0.1)
Basophils Relative: 1 %
Eosinophils Absolute: 0.3 10*3/uL (ref 0.0–0.5)
Eosinophils Relative: 4 %
HCT: 42.7 % (ref 39.0–52.0)
Hemoglobin: 14.5 g/dL (ref 13.0–17.0)
Immature Granulocytes: 1 %
Lymphocytes Relative: 16 %
Lymphs Abs: 1.3 10*3/uL (ref 0.7–4.0)
MCH: 31.9 pg (ref 26.0–34.0)
MCHC: 34 g/dL (ref 30.0–36.0)
MCV: 94.1 fL (ref 80.0–100.0)
Monocytes Absolute: 1.2 10*3/uL — ABNORMAL HIGH (ref 0.1–1.0)
Monocytes Relative: 15 %
Neutro Abs: 5.3 10*3/uL (ref 1.7–7.7)
Neutrophils Relative %: 63 %
Platelets: 138 10*3/uL — ABNORMAL LOW (ref 150–400)
RBC: 4.54 MIL/uL (ref 4.22–5.81)
RDW: 14.8 % (ref 11.5–15.5)
WBC: 8.3 10*3/uL (ref 4.0–10.5)
nRBC: 0 % (ref 0.0–0.2)

## 2019-06-29 LAB — BASIC METABOLIC PANEL
Anion gap: 10 (ref 5–15)
BUN: 12 mg/dL (ref 8–23)
CO2: 25 mmol/L (ref 22–32)
Calcium: 9.2 mg/dL (ref 8.9–10.3)
Chloride: 99 mmol/L (ref 98–111)
Creatinine, Ser: 1.08 mg/dL (ref 0.61–1.24)
GFR calc Af Amer: >60 mL/min (ref >60)
GFR calc non Af Amer: >60 mL/min (ref >60)
Glucose, Bld: 91 mg/dL (ref 70–99)
Potassium: 4.3 mmol/L (ref 3.5–5.1)
Sodium: 134 mmol/L — ABNORMAL LOW (ref 135–145)

## 2019-06-29 LAB — HEPATIC FUNCTION PANEL
ALT: 88 U/L — ABNORMAL HIGH (ref 0–44)
AST: 135 U/L — ABNORMAL HIGH (ref 15–41)
Albumin: 2.8 g/dL — ABNORMAL LOW (ref 3.5–5.0)
Alkaline Phosphatase: 380 U/L — ABNORMAL HIGH (ref 38–126)
Bilirubin, Direct: 4.3 mg/dL — ABNORMAL HIGH (ref 0.0–0.2)
Indirect Bilirubin: 2.8 mg/dL — ABNORMAL HIGH (ref 0.3–0.9)
Total Bilirubin: 7.1 mg/dL — ABNORMAL HIGH (ref 0.3–1.2)
Total Protein: 6.5 g/dL (ref 6.5–8.1)

## 2019-06-29 LAB — PROTIME-INR
INR: 1.3 — ABNORMAL HIGH (ref 0.8–1.2)
Prothrombin Time: 15.8 seconds — ABNORMAL HIGH (ref 11.4–15.2)

## 2019-06-29 MED ORDER — GADOBUTROL 1 MMOL/ML IV SOLN
8.0000 mL | Freq: Once | INTRAVENOUS | Status: AC | PRN
  Administered 2019-06-29: 8 mL via INTRAVENOUS

## 2019-06-29 MED ORDER — MORPHINE SULFATE (PF) 2 MG/ML IV SOLN
2.0000 mg | INTRAVENOUS | Status: DC | PRN
  Administered 2019-06-29 – 2019-07-01 (×3): 2 mg via INTRAVENOUS
  Filled 2019-06-29 (×3): qty 1

## 2019-06-29 NOTE — Progress Notes (Signed)
PROGRESS NOTE  Thomas Riddle [id number] DOB: Aug 01, 1934 DOA: 06/28/2019 PCP: System, Pcp Not In  HPI/Recap of past 24 hours: HPI from Dr Thomas Riddle is a 85 y.o. male with medical history significant of CAD/CABG, trigeminal neuralgia, prostate cancer with radioactive seed implant who is coming to the emergency department with complaints of RUQ pain for the past 3 weeks associated with light-colored stools and dark color urine. In the ED, VS somewhat stable except for BP 181/76 mmHg, labs showed urinalysis with proteinuria 30 mg/dL and small bilirubinuria, Lipase was normal. CMP showed total bilirubin 5.7 mg/dL.  Total protein 7.4 and albumin 3.1 g/dL.  AST 144, ALT 96 and alkaline phosphatase 406 units/L. CT chest/abdomen with contrast showed numerous low-density lesions throughout the liver compatible with metastasis.  Nonspecific borderline mediastinal lymph nodes, 9 mm or less in size.  There is a 5 mm left lower lobe pulmonary nodule. GI consulted for further management.    Today, pt denies any new complaints, denies worsening abdominal pain, N/V/D, fever/chills, chest pain, SOB.    Assessment/Plan: Principal Problem:   Hyperbilirubinemia Active Problems:   Hyperglycemia   CAD (coronary artery disease)   Liver lesions   Abdominal pain/jaundice/hyperbilirubinemia Likely 2/2 metastatic disease, unknown primary, ?? Mass in the tail of pancrease T bili 5.7 on presentation, afebrile, with no leukocytosis CT chest/abdomen with contrast showed numerous low-density lesions throughout the liver compatible with metastasis.  Nonspecific borderline mediastinal lymph nodes, 9 mm or less in size.  There is a 5 mm left lower lobe pulmonary nodule MRI abdomen showed 3.5 x 2.8 x 2.6 cm hypovascular mass in the tail of the pancreas. Correlation with endoscopic ultrasound and biopsy is recommended to establish a tissue diagnosis.  GI on board, appreciate recs Pain management,  anti-emetics Daily CMP  CAD Currently chest pain-free        Malnutrition Type:      Malnutrition Characteristics:      Nutrition Interventions:       Estimated body mass index is 25.48 kg/m as calculated from the following:   Height as of 05/21/12: 5' 8.75" (1.746 m).   Weight as of 05/21/12: 77.7 kg.     Code Status: Full  Family Communication: Discussed extensively with patient  Disposition Plan: Status is: Inpatient  The patient will require care spanning > 2 midnights and should be moved to inpatient because: Ongoing diagnostic testing needed not appropriate for outpatient work up  Dispo: The patient is from: Home              Anticipated d/c is to: Home              Anticipated d/c date is: 1 day              Patient currently is not medically stable to d/c.    Consultants:  GI  Procedures:  None  Antimicrobials:  None  DVT prophylaxis: SCD- hold off AC due to possible intervention   Objective: Vitals:   06/29/19 1130 06/29/19 1145 06/29/19 1200 06/29/19 1209  BP:    (!) 141/76  Pulse: 93 72 79 69  Resp:      Temp:      TempSrc:      SpO2: 97% 94% 94% 93%   No intake or output data in the 24 hours ending 06/29/19 1321 There were no vitals filed for this visit.  Exam:  General: NAD   Cardiovascular: S1, S2 present  Respiratory: CTAB  Abdomen: Soft, nontender, nondistended, bowel sounds present  Musculoskeletal: No bilateral pedal edema noted  Skin: Normal  Psychiatry: Normal mood   Data Reviewed: CBC: Recent Labs  Lab 06/28/19 1633 06/29/19 0610  WBC 8.5 8.3  NEUTROABS  --  5.3  HGB 15.0 14.5  HCT 44.6 42.7  MCV 94.5 94.1  PLT 163 0000000*   Basic Metabolic Panel: Recent Labs  Lab 06/28/19 1633 06/29/19 0610  NA 133* 134*  K 4.4 4.3  CL 96* 99  CO2 27 25  GLUCOSE 145* 91  BUN 12 12  CREATININE 1.20 1.08  CALCIUM 9.3 9.2   GFR: CrCl cannot be calculated (Unknown ideal weight.). Liver Function  Tests: Recent Labs  Lab 06/28/19 1633 06/29/19 0610  AST 144* 135*  ALT 96* 88*  ALKPHOS 406* 380*  BILITOT 5.7* 7.1*  PROT 7.4 6.5  ALBUMIN 3.1* 2.8*   Recent Labs  Lab 06/28/19 1633  LIPASE 38   No results for input(s): AMMONIA in the last 168 hours. Coagulation Profile: Recent Labs  Lab 06/29/19 1017  INR 1.3*   Cardiac Enzymes: No results for input(s): CKTOTAL, CKMB, CKMBINDEX, TROPONINI in the last 168 hours. BNP (last 3 results) No results for input(s): PROBNP in the last 8760 hours. HbA1C: No results for input(s): HGBA1C in the last 72 hours. CBG: No results for input(s): GLUCAP in the last 168 hours. Lipid Profile: No results for input(s): CHOL, HDL, LDLCALC, TRIG, CHOLHDL, LDLDIRECT in the last 72 hours. Thyroid Function Tests: No results for input(s): TSH, T4TOTAL, FREET4, T3FREE, THYROIDAB in the last 72 hours. Anemia Panel: No results for input(s): VITAMINB12, FOLATE, FERRITIN, TIBC, IRON, RETICCTPCT in the last 72 hours. Urine analysis:    Component Value Date/Time   COLORURINE AMBER (A) 06/28/2019 1922   APPEARANCEUR CLEAR 06/28/2019 1922   LABSPEC 1.023 06/28/2019 1922   PHURINE 5.0 06/28/2019 [place] NEGATIVE 06/28/2019 [place] NEGATIVE 06/28/2019 1922   BILIRUBINUR SMALL (A) 06/28/2019 [place] NEGATIVE 06/28/2019 1922   PROTEINUR 30 (A) 06/28/2019 1922   NITRITE NEGATIVE 06/28/2019 1922   LEUKOCYTESUR NEGATIVE 06/28/2019 1922   Sepsis Labs: @LABRCNTIP (procalcitonin:4,lacticidven:4)  ) Recent Results (from the past 240 hour(s))  Respiratory Panel by RT PCR (Flu A&B, Covid) - Nasopharyngeal Swab     Status: None   Collection Time: 06/28/19 10:01 PM   Specimen: Nasopharyngeal Swab  Result Value Ref Range Status   SARS Coronavirus 2 by RT PCR NEGATIVE NEGATIVE Final    Comment: (NOTE) SARS-CoV-2 target nucleic acids are NOT DETECTED. The SARS-CoV-2 RNA is generally detectable in upper respiratoy specimens during the  acute phase of infection. The lowest concentration of SARS-CoV-2 viral copies this assay can detect is 131 copies/mL. A negative result does not preclude SARS-Cov-2 infection and should not be used as the sole basis for treatment or other patient management decisions. A negative result may occur with  improper specimen collection/handling, submission of specimen other than nasopharyngeal swab, presence of viral mutation(s) within the areas targeted by this assay, and inadequate number of viral copies (<131 copies/mL). A negative result must be combined with clinical observations, patient history, and epidemiological information. The expected result is Negative. Fact Sheet for Patients:  PinkCheek.be Fact Sheet for Healthcare Providers:  GravelBags.it This test is not yet ap proved or cleared by the [place] FDA and  has been authorized for detection and/or diagnosis of SARS-CoV-2 by FDA under an Emergency Use Authorization (EUA). This EUA will remain  in effect (meaning this test can be used) for the duration of the COVID-19 declaration under Section 564(b)(1) of the Act, 21 U.S.C. section 360bbb-3(b)(1), unless the authorization is terminated or revoked sooner.    Influenza A by PCR NEGATIVE NEGATIVE Final   Influenza B by PCR NEGATIVE NEGATIVE Final    Comment: (NOTE) The Xpert Xpress SARS-CoV-2/FLU/RSV assay is intended as an aid in  the diagnosis of influenza from Nasopharyngeal swab specimens and  should not be used as a sole basis for treatment. Nasal washings and  aspirates are unacceptable for Xpert Xpress SARS-CoV-2/FLU/RSV  testing. Fact Sheet for Patients: PinkCheek.be Fact Sheet for Healthcare Providers: GravelBags.it This test is not yet approved or cleared by the [place] FDA and  has been authorized for detection and/or diagnosis of SARS-CoV-2  by  FDA under an Emergency Use Authorization (EUA). This EUA will remain  in effect (meaning this test can be used) for the duration of the  Covid-19 declaration under Section 564(b)(1) of the Act, 21  U.S.C. section 360bbb-3(b)(1), unless the authorization is  terminated or revoked. Performed at [facility], [place] [address], [place], [place] [address]       Studies: DG Chest 2 View  Result Date: 06/28/2019 CLINICAL DATA:  Shortness of breath EXAM: CHEST - 2 VIEW COMPARISON:  None. FINDINGS: Post sternotomy changes. No acute consolidation or effusion. Normal heart size. Aortic atherosclerosis. No pneumothorax. IMPRESSION: No active cardiopulmonary disease. Electronically Signed   By: Donavan Foil M.D.   On: 06/28/2019 17:23   CT Chest W Contrast  Result Date: 06/28/2019 CLINICAL DATA:  Right upper quadrant pain EXAM: CT CHEST, ABDOMEN, AND PELVIS WITH CONTRAST TECHNIQUE: Multidetector CT imaging of the chest, abdomen and pelvis was performed following the standard protocol during bolus administration of intravenous contrast. CONTRAST:  116mL OMNIPAQUE IOHEXOL 300 MG/ML  SOLN COMPARISON:  None. FINDINGS: CT CHEST FINDINGS Cardiovascular: Prior CABG. Heart is normal size. Aorta is normal caliber. Aortic atherosclerosis. Mediastinum/Nodes: Numerous small and borderline sized mediastinal lymph nodes. Pretracheal lymph node has a short axis diameter of 9 mm. Other similarly sized or smaller mediastinal lymph nodes. No hilar or axillary adenopathy. Lungs/Pleura: Biapical scarring. 5 mm nodule in the left lower lobe. Linear atelectasis or scarring at the left base. No effusions. Musculoskeletal: Chest wall soft tissues are unremarkable. No acute bony abnormality. CT ABDOMEN PELVIS FINDINGS Hepatobiliary: Innumerable low-density lesions throughout the liver most compatible with metastases. Index right hepatic lesion posteriorly measures 2.5 cm. The other innumerable lesions are similar or smaller in  size. Gallbladder grossly unremarkable. Pancreas: No focal abnormality or ductal dilatation. Spleen: No focal abnormality.  Normal size. Adrenals/Urinary Tract: No adrenal abnormality. No focal renal abnormality. No stones or hydronephrosis. Urinary bladder is unremarkable. Stomach/Bowel: Stomach, large and small bowel grossly unremarkable. Vascular/Lymphatic: Diffuse aortic atherosclerosis. No evidence of aneurysm or adenopathy. Reproductive: Radiation seeds in the region of the prostate. Other: Trace free fluid in the cul-de-sac.  No free air. Musculoskeletal: No acute bony abnormality. IMPRESSION: Innumerable low-density lesions throughout the liver compatible with metastases. Nonspecific borderline mediastinal lymph nodes, 9 mm or less in size. 5 mm left lower lobe pulmonary nodule. Biapical scarring.  Left base scarring or atelectasis. Aortic atherosclerosis. Electronically Signed   By: Rolm Baptise M.D.   On: 06/28/2019 21:32   CT ABDOMEN PELVIS W CONTRAST  Result Date: 06/28/2019 CLINICAL DATA:  Right upper quadrant pain EXAM: CT CHEST, ABDOMEN, AND PELVIS WITH CONTRAST TECHNIQUE: Multidetector CT imaging of the  chest, abdomen and pelvis was performed following the standard protocol during bolus administration of intravenous contrast. CONTRAST:  145mL OMNIPAQUE IOHEXOL 300 MG/ML  SOLN COMPARISON:  None. FINDINGS: CT CHEST FINDINGS Cardiovascular: Prior CABG. Heart is normal size. Aorta is normal caliber. Aortic atherosclerosis. Mediastinum/Nodes: Numerous small and borderline sized mediastinal lymph nodes. Pretracheal lymph node has a short axis diameter of 9 mm. Other similarly sized or smaller mediastinal lymph nodes. No hilar or axillary adenopathy. Lungs/Pleura: Biapical scarring. 5 mm nodule in the left lower lobe. Linear atelectasis or scarring at the left base. No effusions. Musculoskeletal: Chest wall soft tissues are unremarkable. No acute bony abnormality. CT ABDOMEN PELVIS FINDINGS  Hepatobiliary: Innumerable low-density lesions throughout the liver most compatible with metastases. Index right hepatic lesion posteriorly measures 2.5 cm. The other innumerable lesions are similar or smaller in size. Gallbladder grossly unremarkable. Pancreas: No focal abnormality or ductal dilatation. Spleen: No focal abnormality.  Normal size. Adrenals/Urinary Tract: No adrenal abnormality. No focal renal abnormality. No stones or hydronephrosis. Urinary bladder is unremarkable. Stomach/Bowel: Stomach, large and small bowel grossly unremarkable. Vascular/Lymphatic: Diffuse aortic atherosclerosis. No evidence of aneurysm or adenopathy. Reproductive: Radiation seeds in the region of the prostate. Other: Trace free fluid in the cul-de-sac.  No free air. Musculoskeletal: No acute bony abnormality. IMPRESSION: Innumerable low-density lesions throughout the liver compatible with metastases. Nonspecific borderline mediastinal lymph nodes, 9 mm or less in size. 5 mm left lower lobe pulmonary nodule. Biapical scarring.  Left base scarring or atelectasis. Aortic atherosclerosis. Electronically Signed   By: Rolm Baptise M.D.   On: 06/28/2019 21:32   MR ABDOMEN MRCP W WO CONTAST  Result Date: 06/29/2019 CLINICAL DATA:  85 year old male with history of right upper quadrant abdominal pain for the past 3 weeks. Dark colored urine and light colored stool. Acute hepatitis. Multiple hypovascular hepatic lesions noted on recent CT the abdomen and pelvis. EXAM: MRI ABDOMEN WITHOUT AND WITH CONTRAST (INCLUDING MRCP) TECHNIQUE: Multiplanar multisequence MR imaging of the abdomen was performed both before and after the administration of intravenous contrast. Heavily T2-weighted images of the biliary and pancreatic ducts were obtained, and three-dimensional MRCP images were rendered by post processing. CONTRAST:  14mL GADAVIST GADOBUTROL 1 MMOL/ML IV SOLN COMPARISON:  No prior abdominal MRI. CT the abdomen and pelvis 06/28/2019.  FINDINGS: Lower chest: Scarring in the posterior aspect of the left lower lobe, better demonstrated on recent chest CT. Hepatobiliary: Innumerable T1 hypointense, T2 hyperintense hypovascular lesions scattered throughout the hepatic parenchyma, indicative of widespread metastatic disease. The largest of these lesions is noted in the central aspect of the liver between segments 4A and 8 (axial image 13 of series 8) measuring 3.6 x 2.3 cm. No intra or extrahepatic biliary ductal dilatation. Gallbladder is nearly decompressed. Gallbladder wall appears mildly thickened and edematous. No filling defects in the gallbladder to suggest cholelithiasis. Pancreas: There is a poorly defined mass in the tail of the pancreas which is slightly T1 hypointense, T2 hypointense and appears hypovascular on post gadolinium imaging measuring 3.5 x 2.8 x 2.6 cm (axial image 54 of series 33 and coronal image 35 of series 39). This is intimately associated with the splenic hilum, without definite evidence of direct splenic invasion. No pancreatic ductal dilatation. No pancreatic or peripancreatic fluid collections or inflammatory changes. Spleen: Mass in tail of pancreas intimately associated with the splenic hilum without evidence of direct invasion. Adrenals/Urinary Tract: Subcentimeter T1 hypointense, T2 hyperintense, nonenhancing lesions in both kidneys, compatible with tiny simple cysts. No hydroureteronephrosis in  the visualized portions of the abdomen. Bilateral adrenal glands are normal in appearance. Stomach/Bowel: Visualized portions are unremarkable. Vascular/Lymphatic: Aortic atherosclerosis, without evidence of aneurysm in the abdominal vasculature. Lesion in the tail of the pancreas is intimately associated with the splenic artery and vein. Prominent but nonenlarged portacaval lymph node measuring 9 mm in short axis which demonstrates diffusion restriction. Other: No significant volume of ascites noted in the visualized  portions of the peritoneal cavity. Musculoskeletal: No suspicious osseous lesions noted in the visualized portions of the skeleton. IMPRESSION: 1. 3.5 x 2.8 x 2.6 cm hypovascular mass in the tail of the pancreas intimately associated with the splenic hilum without definite evidence of direct splenic invasion. Correlation with endoscopic ultrasound and biopsy is recommended to establish a tissue diagnosis. 2. Innumerable lesions noted throughout the liver compatible with widespread metastatic disease. 3. Nonenlarged but suspicious 9 mm short axis portacaval lymph node which demonstrates diffusion restriction, likely to represent nodal metastasis. 4. Aortic atherosclerosis. Electronically Signed   By: Vinnie Langton M.D.   On: 06/29/2019 11:16    Scheduled Meds: . chlorhexidine  15 mL Mouth/Throat TID    Continuous Infusions:   LOS: 0 days     Alma Friendly, MD [facility]  If 7PM-7AM, please contact night-coverage [email or web address] 06/29/2019, 1:21 PM

## 2019-06-29 NOTE — Consult Note (Addendum)
[facility] Consult: 9:28 AM 06/29/2019  LOS: 0 days    Referring Provider: Dr Horris Latino  Primary Care Physician:  Primary care service at the [facility] Primary Gastroenterologist:  unassigned     Reason for Consultation: Liver lesions, jaundice.   HPI: Thomas Riddle is a 85 y.o. male.  S/p 4 V CABG ~ 2005.  Appendectomy age 70.  Right inguinal hernia repair w mesh.  Prostate cancer treated with radiation and seed implants ~ 2000 with subsequent normal PSA.  Previous colonoscopy at [facility], had polyps of unclear type.  Last colonoscopy 5 or more years ago and has aged out of surveillance colonoscopy. Takes no regular meds.  Has had trigeminal neuralgia and on April 1 he underwent recommended tooth extraction to left upper jaw molar.  It has been slow to heal.  Is previously healthy and judicious diet consisting of orange juice in the morning, yogurt, raisins, walnuts at lunch and salad with added meat and sardines at dinner has modified a bit as he is no longer eating knots and things that can get caught in the extraction socket.  He estimates he eats maybe 1000 cal a day but he has not lost any weight. About 3 weeks ago developed right upper quadrant pain sometimes radiating to epigastrium, dark urine, occasional pale stools which floated to the surface of the toilet.  No nausea, vomiting, no dysphagia.  No pruritus.  Called the [facility] who advised him he needed to go to an emergency room so he came to [facility] yesterday afternoon. T bili 5.7 >> 7.1, direct bili predominant..  Alk phos 406 >> 380..  AST/ALT 144/96 >> 135/88.  Lipase 38.   CTAP, CT chest:   Multiple low-density hepatic lesions, C/W metastasis.  Borderline mediastinal lymph nodes.  5 mm LLL pulmonary nodule.  Radiation seeds in the prostate.  Unremarkable stomach, large and  small bowel. MRI abdomen/MRCP: Completed, await reading.  Social history Patient is retired after 8 years service in the [employer].  He has a girlfriend.  Before April 1 he drank maybe 2 glasses of wine daily at the most but since the dental extraction he has not been drinking.  Not a smoker. Family history Colon cancer in a sister, lung cancer in her brother, alcohol abuse in his father.   Past Medical History:  Diagnosis Date  . CAD (coronary artery disease) 06/28/2019  . Prostate cancer [facility])     Past Surgical History:  Procedure Laterality Date  . APPENDECTOMY    . CORONARY ARTERY BYPASS GRAFT    . HERNIA REPAIR    . RADIOACTIVE SEED IMPLANT N/A    Prostate cancer    Prior to Admission medications   Medication Sig Start Date End Date Taking? Authorizing Provider  chlorhexidine (PERIDEX) 0.12 % solution Use as directed 15 mLs in the mouth or throat 3 (three) times daily.   Yes [provider]    Scheduled Meds: . chlorhexidine  15 mL Mouth/Throat TID   Infusions:  PRN Meds: morphine injection, ondansetron **OR** ondansetron (ZOFRAN) IV  Allergies as of 06/28/2019 - Review Complete 06/28/2019  Allergen Reaction Noted  . Amoxicillin sodium Rash 05/31/2011  . Penicillins Rash 05/31/2011    Family History  Problem Relation Age of Onset  . Alcohol abuse Father   . Colon cancer Sister   . Lung cancer Brother     Social History   Socioeconomic History  . Marital status: Widowed    Spouse name: Not on file  . Number of children: Not on file  . Years of education: Not on file  . Highest education level: Not on file  Occupational History  . Not on file  Tobacco Use  . Smoking status: Never Smoker  Substance and Sexual Activity  . Alcohol use: Yes  . Drug use: No  . Sexual activity: Not on file  Other Topics Concern  . Not on file  Social History Narrative  . Not on file   Social Determinants of Health   Financial Resource Strain:   .  Difficulty of Paying Living Expenses:   Food Insecurity:   . Worried About Charity fundraiser in the Last Year:   . Arboriculturist in the Last Year:   Transportation Needs:   . Film/video editor (Medical):   Marland Kitchen Lack of Transportation (Non-Medical):   Physical Activity:   . Days of Exercise per Week:   . Minutes of Exercise per Session:   Stress:   . Feeling of Stress :   Social Connections:   . Frequency of Communication with Friends and Family:   . Frequency of Social Gatherings with Friends and Family:   . Attends Religious Services:   . Active Member of Clubs or Organizations:   . Attends Archivist Meetings:   Marland Kitchen Marital Status:   Intimate Partner Violence:   . Fear of Current or Ex-Partner:   . Emotionally Abused:   Marland Kitchen Physically Abused:   . Sexually Abused:     REVIEW OF SYSTEMS: Constitutional: Some fatigue, not profound ENT:  No nose bleeds Pulm: No DOE. CV:  No palpitations, no LE edema.  No angina. GU:  No hematuria, no frequency GI: See HPI. Heme: Denies unusual or excessive bleeding/bruising. Transfusions: None. Neuro:  No headaches, no peripheral tingling or numbness.  No syncope, no seizures. Derm:  No itching, no rash or sores.  Endocrine:  No sweats or chills.  No polyuria or dysuria Immunization: Does not believe in getting flu shots and does not plan to receive Covid vaccination Travel:  None beyond local counties in last few months.    PHYSICAL EXAM: Vital signs in last 24 hours: Vitals:   06/29/19 0126 06/29/19 0632  BP: (!) 152/79 (!) 151/74  Pulse: 76 72  Resp: 18 16  Temp: 98.6 F (37 C)   SpO2: 98% 99%   Wt Readings from Last 3 Encounters:  05/21/12 77.7 kg  05/31/11 77.1 kg    General: Patient looks 15 years younger than stated age of 67.  Lean but not cachectic. Head: No facial asymmetry or swelling.  No signs of head trauma. Eyes: Mild icterus.  No conjunctival pallor.  EOMI. Ears: No obvious hearing deficit Nose:  No congestion or discharge Mouth: Oropharynx moist, pink, clear. Neck: No JVD, no masses, no thyromegaly. Lungs: Diminished overall but clear.  No labored breathing, no cough. Heart: RRR.  No MRG.  S1, S2 present. Abdomen: Soft, nondistended no HSM, masses, bruits, organomegaly appreciated.  Active bowel sounds.  Slight right upper quadrant tenderness  without guarding or rebound Rectal: Deferred Musc/Skeltl: Slight kyphosis.  No contracture deformities or swelling. Extremities: No CCE. Neurologic: Alert.  Oriented x3.  Good historian.  No tremors or limb weakness. Skin: No rash, sores, telangiectasia. Tattoos: None observed. Nodes: No cervical adenopathy Psych: Cooperative, fluid speech, slightly anxious.  Pleasant demeanor.  Intake/Output from previous day: No intake/output data recorded. Intake/Output this shift: No intake/output data recorded.  LAB RESULTS: Recent Labs    06/28/19 1633 06/29/19 0610  WBC 8.5 8.3  HGB 15.0 14.5  HCT 44.6 42.7  PLT 163 138*   BMET Lab Results  Component Value Date   NA 134 (L) 06/29/2019   NA 133 (L) 06/28/2019   NA 138 05/31/2011   K 4.3 06/29/2019   K 4.4 06/28/2019   K 4.4 05/31/2011   CL 99 06/29/2019   CL 96 (L) 06/28/2019   CL 96 05/31/2011   CO2 25 06/29/2019   CO2 27 06/28/2019   CO2 29 05/31/2011   GLUCOSE 91 06/29/2019   GLUCOSE 145 (H) 06/28/2019   GLUCOSE 168 (H) 05/31/2011   BUN 12 06/29/2019   BUN 12 06/28/2019   BUN 18 05/31/2011   CREATININE 1.08 06/29/2019   CREATININE 1.20 06/28/2019   CREATININE 1.34 05/31/2011   CALCIUM 9.2 06/29/2019   CALCIUM 9.3 06/28/2019   CALCIUM 11.0 (H) 05/31/2011   LFT Recent Labs    06/28/19 1633 06/29/19 0610  PROT 7.4 6.5  ALBUMIN 3.1* 2.8*  AST 144* 135*  ALT 96* 88*  ALKPHOS 406* 380*  BILITOT 5.7* 7.1*  BILIDIR  --  4.3*  IBILI  --  2.8*   PT/INR No results found for: INR, PROTIME Hepatitis Panel No results for input(s): HEPBSAG, HCVAB, HEPAIGM, HEPBIGM in  the last 72 hours. Lipase     Component Value Date/Time   LIPASE 38 06/28/2019 1633      RADIOLOGY STUDIES: DG Chest 2 View  Result Date: 06/28/2019 CLINICAL DATA:  Shortness of breath EXAM: CHEST - 2 VIEW COMPARISON:  None. FINDINGS: Post sternotomy changes. No acute consolidation or effusion. Normal heart size. Aortic atherosclerosis. No pneumothorax. IMPRESSION: No active cardiopulmonary disease. Electronically Signed   By: Donavan Foil M.D.   On: 06/28/2019 17:23   CT Chest W Contrast  Result Date: 06/28/2019 CLINICAL DATA:  Right upper quadrant pain EXAM: CT CHEST, ABDOMEN, AND PELVIS WITH CONTRAST TECHNIQUE: Multidetector CT imaging of the chest, abdomen and pelvis was performed following the standard protocol during bolus administration of intravenous contrast. CONTRAST:  152m OMNIPAQUE IOHEXOL 300 MG/ML  SOLN COMPARISON:  None. FINDINGS: CT CHEST FINDINGS Cardiovascular: Prior CABG. Heart is normal size. Aorta is normal caliber. Aortic atherosclerosis. Mediastinum/Nodes: Numerous small and borderline sized mediastinal lymph nodes. Pretracheal lymph node has a short axis diameter of 9 mm. Other similarly sized or smaller mediastinal lymph nodes. No hilar or axillary adenopathy. Lungs/Pleura: Biapical scarring. 5 mm nodule in the left lower lobe. Linear atelectasis or scarring at the left base. No effusions. Musculoskeletal: Chest wall soft tissues are unremarkable. No acute bony abnormality. CT ABDOMEN PELVIS FINDINGS Hepatobiliary: Innumerable low-density lesions throughout the liver most compatible with metastases. Index right hepatic lesion posteriorly measures 2.5 cm. The other innumerable lesions are similar or smaller in size. Gallbladder grossly unremarkable. Pancreas: No focal abnormality or ductal dilatation. Spleen: No focal abnormality.  Normal size. Adrenals/Urinary Tract: No adrenal abnormality. No focal renal abnormality. No stones or hydronephrosis. Urinary bladder is  unremarkable. Stomach/Bowel: Stomach, large and small bowel grossly unremarkable.  Vascular/Lymphatic: Diffuse aortic atherosclerosis. No evidence of aneurysm or adenopathy. Reproductive: Radiation seeds in the region of the prostate. Other: Trace free fluid in the cul-de-sac.  No free air. Musculoskeletal: No acute bony abnormality. IMPRESSION: Innumerable low-density lesions throughout the liver compatible with metastases. Nonspecific borderline mediastinal lymph nodes, 9 mm or less in size. 5 mm left lower lobe pulmonary nodule. Biapical scarring.  Left base scarring or atelectasis. Aortic atherosclerosis. Electronically Signed   By: Rolm Baptise M.D.   On: 06/28/2019 21:32   CT ABDOMEN PELVIS W CONTRAST  Result Date: 06/28/2019 CLINICAL DATA:  Right upper quadrant pain EXAM: CT CHEST, ABDOMEN, AND PELVIS WITH CONTRAST TECHNIQUE: Multidetector CT imaging of the chest, abdomen and pelvis was performed following the standard protocol during bolus administration of intravenous contrast. CONTRAST:  12m OMNIPAQUE IOHEXOL 300 MG/ML  SOLN COMPARISON:  None. FINDINGS: CT CHEST FINDINGS Cardiovascular: Prior CABG. Heart is normal size. Aorta is normal caliber. Aortic atherosclerosis. Mediastinum/Nodes: Numerous small and borderline sized mediastinal lymph nodes. Pretracheal lymph node has a short axis diameter of 9 mm. Other similarly sized or smaller mediastinal lymph nodes. No hilar or axillary adenopathy. Lungs/Pleura: Biapical scarring. 5 mm nodule in the left lower lobe. Linear atelectasis or scarring at the left base. No effusions. Musculoskeletal: Chest wall soft tissues are unremarkable. No acute bony abnormality. CT ABDOMEN PELVIS FINDINGS Hepatobiliary: Innumerable low-density lesions throughout the liver most compatible with metastases. Index right hepatic lesion posteriorly measures 2.5 cm. The other innumerable lesions are similar or smaller in size. Gallbladder grossly unremarkable. Pancreas: No focal  abnormality or ductal dilatation. Spleen: No focal abnormality.  Normal size. Adrenals/Urinary Tract: No adrenal abnormality. No focal renal abnormality. No stones or hydronephrosis. Urinary bladder is unremarkable. Stomach/Bowel: Stomach, large and small bowel grossly unremarkable. Vascular/Lymphatic: Diffuse aortic atherosclerosis. No evidence of aneurysm or adenopathy. Reproductive: Radiation seeds in the region of the prostate. Other: Trace free fluid in the cul-de-sac.  No free air. Musculoskeletal: No acute bony abnormality. IMPRESSION: Innumerable low-density lesions throughout the liver compatible with metastases. Nonspecific borderline mediastinal lymph nodes, 9 mm or less in size. 5 mm left lower lobe pulmonary nodule. Biapical scarring.  Left base scarring or atelectasis. Aortic atherosclerosis. Electronically Signed   By: KRolm BaptiseM.D.   On: 06/28/2019 21:32    IMPRESSION:   *    Jaundice with metastatic lesions in the liver per CT and MR. Based on the chest CT, looks like he may have a lung primary.  *   Hx prostate cancer treated with radiation seeds and external radiation ~2000.  *    Hx colon polyp of unclear type.  Aged out of surveillance colonoscopy.  [place] [facility] [place] [facility] [place]  *    Mild hyponatremia.   PLAN:     *   Await MRI/MRCP reading  *  Liver biopsy?   Need to check PT/INR to ascertain if there is any compromise in synthetic function of his liver. Allow regular diet, ordered.   SAzucena Freed 06/29/2019, 9:28 AM Phone [phone]

## 2019-06-29 NOTE — Plan of Care (Signed)

## 2019-06-29 NOTE — ED Notes (Signed)
Pt returned from MRI, given two warm blankets for comfort. Head of bed lowered, no other needs expressed at this time.

## 2019-06-29 NOTE — ED Notes (Signed)
Lunch Tray Ordered @ 1033. 

## 2019-06-29 NOTE — ED Notes (Signed)
Pt resting comfortably, respirations even & unlabored. No needs expressed at this time.

## 2019-06-30 ENCOUNTER — Encounter (HOSPITAL_COMMUNITY): Payer: Self-pay | Admitting: Internal Medicine

## 2019-06-30 DIAGNOSIS — K769 Liver disease, unspecified: Secondary | ICD-10-CM | POA: Diagnosis not present

## 2019-06-30 DIAGNOSIS — K8689 Other specified diseases of pancreas: Secondary | ICD-10-CM

## 2019-06-30 DIAGNOSIS — R7989 Other specified abnormal findings of blood chemistry: Secondary | ICD-10-CM

## 2019-06-30 LAB — CBC WITH DIFFERENTIAL/PLATELET
Abs Immature Granulocytes: 0.07 10*3/uL (ref 0.00–0.07)
Basophils Absolute: 0.1 10*3/uL (ref 0.0–0.1)
Basophils Relative: 1 %
Eosinophils Absolute: 0.3 10*3/uL (ref 0.0–0.5)
Eosinophils Relative: 3 %
HCT: 44.1 % (ref 39.0–52.0)
Hemoglobin: 15 g/dL (ref 13.0–17.0)
Immature Granulocytes: 1 %
Lymphocytes Relative: 16 %
Lymphs Abs: 1.5 10*3/uL (ref 0.7–4.0)
MCH: 31.7 pg (ref 26.0–34.0)
MCHC: 34 g/dL (ref 30.0–36.0)
MCV: 93.2 fL (ref 80.0–100.0)
Monocytes Absolute: 1.1 10*3/uL — ABNORMAL HIGH (ref 0.1–1.0)
Monocytes Relative: 12 %
Neutro Abs: 6.3 10*3/uL (ref 1.7–7.7)
Neutrophils Relative %: 67 %
Platelets: 163 10*3/uL (ref 150–400)
RBC: 4.73 MIL/uL (ref 4.22–5.81)
RDW: 14.8 % (ref 11.5–15.5)
WBC: 9.4 10*3/uL (ref 4.0–10.5)
nRBC: 0 % (ref 0.0–0.2)

## 2019-06-30 LAB — COMPREHENSIVE METABOLIC PANEL
ALT: 98 U/L — ABNORMAL HIGH (ref 0–44)
AST: 155 U/L — ABNORMAL HIGH (ref 15–41)
Albumin: 2.8 g/dL — ABNORMAL LOW (ref 3.5–5.0)
Alkaline Phosphatase: 407 U/L — ABNORMAL HIGH (ref 38–126)
Anion gap: 10 (ref 5–15)
BUN: 14 mg/dL (ref 8–23)
CO2: 26 mmol/L (ref 22–32)
Calcium: 9.3 mg/dL (ref 8.9–10.3)
Chloride: 100 mmol/L (ref 98–111)
Creatinine, Ser: 1.24 mg/dL (ref 0.61–1.24)
GFR calc Af Amer: >60 mL/min (ref >60)
GFR calc non Af Amer: 53 mL/min — ABNORMAL LOW (ref >60)
Glucose, Bld: 116 mg/dL — ABNORMAL HIGH (ref 70–99)
Potassium: 3.9 mmol/L (ref 3.5–5.1)
Sodium: 136 mmol/L (ref 135–145)
Total Bilirubin: 6.5 mg/dL — ABNORMAL HIGH (ref 0.3–1.2)
Total Protein: 6.5 g/dL (ref 6.5–8.1)

## 2019-06-30 LAB — CEA: CEA: 61.8 ng/mL — ABNORMAL HIGH (ref 0.0–4.7)

## 2019-06-30 MED ORDER — MORPHINE SULFATE (PF) 2 MG/ML IV SOLN
1.0000 mg | INTRAVENOUS | Status: AC
  Administered 2019-06-30: 1 mg via INTRAVENOUS
  Filled 2019-06-30: qty 1

## 2019-06-30 NOTE — Progress Notes (Signed)
Per [facility] in Radiology biopsy will need to be re-scheduled until tomorrow. Notified Azucena Freed, PA and Dr. Wyline Copas.

## 2019-06-30 NOTE — Progress Notes (Signed)
Pt refused his SCD and bed alarm activation despite education regarding safety and blood clots prevention, pt oriented x 4, was however reassured and will continue to monitor, v/s stable. Obasogie-Asidi, Nakeda Lebron Efe

## 2019-06-30 NOTE — Progress Notes (Signed)
PROGRESS NOTE    Thomas Riddle  [id number] DOB: [birth year] DOA: 06/28/2019 PCP: System, Pcp Not In    Brief Narrative:  85 y.o.malewith medical history significant ofCAD/CABG,trigeminal neuralgia,prostate cancerwith radioactive seed implant who is coming to the emergency department with complaints of RUQ pain for the past 3 weeks associated with light-colored stools and dark color urine. In the ED, VS somewhat stable except for BP 181/76 mmHg, labs showed urinalysis with proteinuria 30 mg/dL and small bilirubinuria, Lipase was normal. CMP showed total bilirubin 5.7 mg/dL. Total protein 7.4 and albumin 3.1 g/dL. AST 144, ALT 96 and alkaline phosphatase 406 units/L. CT chest/abdomen with contrast showed numerous low-density lesions throughout the liver compatible with metastasis. Nonspecific borderline mediastinal lymph nodes, 9 mm or less in size. There is a 5 mm left lower lobe pulmonary nodule. GI consulted for further management.  Assessment & Plan:   Principal Problem:   Hyperbilirubinemia Active Problems:   Hyperglycemia   CAD (coronary artery disease)   Liver lesions  Abdominal pain/jaundice/hyperbilirubinemia Likely 2/2 metastatic disease, unknown primary, ?? Mass in the tail of pancrease T bili 5.7 on presentation, afebrile, with no leukocytosis CT chest/abdomen with contrast showed numerous low-density lesions throughout the liver compatible with metastasis. Nonspecific borderline mediastinal lymph nodes, 9 mm or less in size. There is a 5 mm left lower lobe pulmonary nodule MRI abdomen showed 3.5 x 2.8 x 2.6 cm hypovascular mass in the tail of the pancreas.   GI on board, appreciate recs. Plan for US guided biopsy today Cont with pain management, anti-emetics Recheck cmp in AM  CAD Presently chest pain-free  DVT prophylaxis: SCD's Code Status: Full Family Communication: Pt in room, family not at bedside  Status is: Inpatient  Remains inpatient appropriate  because:Ongoing diagnostic testing needed not appropriate for outpatient work up   Dispo: The patient is from: Home              Anticipated d/c is to: Home              Anticipated d/c date is: 1 day              Patient currently is not medically stable to d/c.  Consultants:   GI  Procedures:     Antimicrobials: Anti-infectives (From admission, onward)   None       Subjective: Complains of continued abd discomfort  Objective: Vitals:   06/29/19 2333 06/30/19 0334 06/30/19 0742 06/30/19 1116  BP: 136/68 (!) 157/68 (!) 152/62 (!) 147/82  Pulse: 75 93 87 86  Resp: 16 17 18 18   Temp: 98.2 F (36.8 C) 97.6 F (36.4 C) 97.7 F (36.5 C) 97.8 F (36.6 C)  TempSrc: Oral Oral Oral Oral  SpO2: 96% 94% 98% 96%  Weight:      Height:        Intake/Output Summary (Last 24 hours) at 06/30/2019 1444 Last data filed at 06/29/2019 1500 Gross per 24 hour  Intake 240 ml  Output --  Net 240 ml   Filed Weights   06/29/19 1954  Weight: 74.2 kg    Examination:  General exam: Appears calm and comfortable  Respiratory system: Clear to auscultation. Respiratory effort normal. Cardiovascular system: S1 & S2 heard, Regular Gastrointestinal system: Abdomen is nondistended, soft pos BS Central nervous system: Alert and oriented. No focal neurological deficits. Extremities: Symmetric 5 x 5 power. Skin: No rashes, lesions Psychiatry: Judgement and insight appear normal. Mood & affect appropriate.   Data Reviewed: I have  personally reviewed following labs and imaging studies  CBC: Recent Labs  Lab 06/28/19 1633 06/29/19 0610 06/30/19 0447  WBC 8.5 8.3 9.4  NEUTROABS  --  5.3 6.3  HGB 15.0 14.5 15.0  HCT 44.6 42.7 44.1  MCV 94.5 94.1 93.2  PLT 163 138* [id number]   Basic Metabolic Panel: Recent Labs  Lab 06/28/19 1633 06/29/19 0610 06/30/19 0447  NA 133* 134* 136  K 4.4 4.3 3.9  CL 96* 99 100  CO2 27 25 26   GLUCOSE 145* 91 116*  BUN 12 12 14   CREATININE 1.20 1.08 1.24   CALCIUM 9.3 9.2 9.3   GFR: Estimated Creatinine Clearance: 45.1 mL/min (by C-G formula based on SCr of 1.24 mg/dL). Liver Function Tests: Recent Labs  Lab 06/28/19 1633 06/29/19 0610 06/30/19 0447  AST 144* 135* 155*  ALT 96* 88* 98*  ALKPHOS 406* 380* 407*  BILITOT 5.7* 7.1* 6.5*  PROT 7.4 6.5 6.5  ALBUMIN 3.1* 2.8* 2.8*   Recent Labs  Lab 06/28/19 1633  LIPASE 38   No results for input(s): AMMONIA in the last 168 hours. Coagulation Profile: Recent Labs  Lab 06/29/19 1017  INR 1.3*   Cardiac Enzymes: No results for input(s): CKTOTAL, CKMB, CKMBINDEX, TROPONINI in the last 168 hours. BNP (last 3 results) No results for input(s): PROBNP in the last 8760 hours. HbA1C: No results for input(s): HGBA1C in the last 72 hours. CBG: No results for input(s): GLUCAP in the last 168 hours. Lipid Profile: No results for input(s): CHOL, HDL, LDLCALC, TRIG, CHOLHDL, LDLDIRECT in the last 72 hours. Thyroid Function Tests: No results for input(s): TSH, T4TOTAL, FREET4, T3FREE, THYROIDAB in the last 72 hours. Anemia Panel: No results for input(s): VITAMINB12, FOLATE, FERRITIN, TIBC, IRON, RETICCTPCT in the last 72 hours. Sepsis Labs: No results for input(s): PROCALCITON, LATICACIDVEN in the last 168 hours.  Recent Results (from the past 240 hour(s))  Respiratory Panel by RT PCR (Flu A&B, Covid) - Nasopharyngeal Swab     Status: None   Collection Time: 06/28/19 10:01 PM   Specimen: Nasopharyngeal Swab  Result Value Ref Range Status   SARS Coronavirus 2 by RT PCR NEGATIVE NEGATIVE Final    Comment: (NOTE) SARS-CoV-2 target nucleic acids are NOT DETECTED. The SARS-CoV-2 RNA is generally detectable in upper respiratoy specimens during the acute phase of infection. The lowest concentration of SARS-CoV-2 viral copies this assay can detect is 131 copies/mL. A negative result does not preclude SARS-Cov-2 infection and should not be used as the sole basis for treatment or other  patient management decisions. A negative result may occur with  improper specimen collection/handling, submission of specimen other than nasopharyngeal swab, presence of viral mutation(s) within the areas targeted by this assay, and inadequate number of viral copies (<131 copies/mL). A negative result must be combined with clinical observations, patient history, and epidemiological information. The expected result is Negative. Fact Sheet for Patients:  PinkCheek.be Fact Sheet for Healthcare Providers:  GravelBags.it This test is not yet ap proved or cleared by the [place] FDA and  has been authorized for detection and/or diagnosis of SARS-CoV-2 by FDA under an Emergency Use Authorization (EUA). This EUA will remain  in effect (meaning this test can be used) for the duration of the COVID-19 declaration under Section 564(b)(1) of the Act, 21 U.S.C. section 360bbb-3(b)(1), unless the authorization is terminated or revoked sooner.    Influenza A by PCR NEGATIVE NEGATIVE Final   Influenza B by PCR NEGATIVE NEGATIVE Final  Comment: (NOTE) The Xpert Xpress SARS-CoV-2/FLU/RSV assay is intended as an aid in  the diagnosis of influenza from Nasopharyngeal swab specimens and  should not be used as a sole basis for treatment. Nasal washings and  aspirates are unacceptable for Xpert Xpress SARS-CoV-2/FLU/RSV  testing. Fact Sheet for Patients: PinkCheek.be Fact Sheet for Healthcare Providers: GravelBags.it This test is not yet approved or cleared by the [place] FDA and  has been authorized for detection and/or diagnosis of SARS-CoV-2 by  FDA under an Emergency Use Authorization (EUA). This EUA will remain  in effect (meaning this test can be used) for the duration of the  Covid-19 declaration under Section 564(b)(1) of the Act, 21  U.S.C. section 360bbb-3(b)(1), unless  the authorization is  terminated or revoked. Performed at [facility], [place] [address] [place],  [address]      Radiology Studies: DG Chest 2 View  Result Date: 06/28/2019 CLINICAL DATA:  Shortness of breath EXAM: CHEST - 2 VIEW COMPARISON:  None. FINDINGS: Post sternotomy changes. No acute consolidation or effusion. Normal heart size. Aortic atherosclerosis. No pneumothorax. IMPRESSION: No active cardiopulmonary disease. Electronically Signed   By: Donavan Foil M.D.   On: 06/28/2019 17:23   CT Chest W Contrast  Result Date: 06/28/2019 CLINICAL DATA:  Right upper quadrant pain EXAM: CT CHEST, ABDOMEN, AND PELVIS WITH CONTRAST TECHNIQUE: Multidetector CT imaging of the chest, abdomen and pelvis was performed following the standard protocol during bolus administration of intravenous contrast. CONTRAST:  169mL OMNIPAQUE IOHEXOL 300 MG/ML  SOLN COMPARISON:  None. FINDINGS: CT CHEST FINDINGS Cardiovascular: Prior CABG. Heart is normal size. Aorta is normal caliber. Aortic atherosclerosis. Mediastinum/Nodes: Numerous small and borderline sized mediastinal lymph nodes. Pretracheal lymph node has a short axis diameter of 9 mm. Other similarly sized or smaller mediastinal lymph nodes. No hilar or axillary adenopathy. Lungs/Pleura: Biapical scarring. 5 mm nodule in the left lower lobe. Linear atelectasis or scarring at the left base. No effusions. Musculoskeletal: Chest wall soft tissues are unremarkable. No acute bony abnormality. CT ABDOMEN PELVIS FINDINGS Hepatobiliary: Innumerable low-density lesions throughout the liver most compatible with metastases. Index right hepatic lesion posteriorly measures 2.5 cm. The other innumerable lesions are similar or smaller in size. Gallbladder grossly unremarkable. Pancreas: No focal abnormality or ductal dilatation. Spleen: No focal abnormality.  Normal size. Adrenals/Urinary Tract: No adrenal abnormality. No focal renal abnormality. No stones or  hydronephrosis. Urinary bladder is unremarkable. Stomach/Bowel: Stomach, large and small bowel grossly unremarkable. Vascular/Lymphatic: Diffuse aortic atherosclerosis. No evidence of aneurysm or adenopathy. Reproductive: Radiation seeds in the region of the prostate. Other: Trace free fluid in the cul-de-sac.  No free air. Musculoskeletal: No acute bony abnormality. IMPRESSION: Innumerable low-density lesions throughout the liver compatible with metastases. Nonspecific borderline mediastinal lymph nodes, 9 mm or less in size. 5 mm left lower lobe pulmonary nodule. Biapical scarring.  Left base scarring or atelectasis. Aortic atherosclerosis. Electronically Signed   By: Rolm Baptise M.D.   On: 06/28/2019 21:32   CT ABDOMEN PELVIS W CONTRAST  Result Date: 06/28/2019 CLINICAL DATA:  Right upper quadrant pain EXAM: CT CHEST, ABDOMEN, AND PELVIS WITH CONTRAST TECHNIQUE: Multidetector CT imaging of the chest, abdomen and pelvis was performed following the standard protocol during bolus administration of intravenous contrast. CONTRAST:  12mL OMNIPAQUE IOHEXOL 300 MG/ML  SOLN COMPARISON:  None. FINDINGS: CT CHEST FINDINGS Cardiovascular: Prior CABG. Heart is normal size. Aorta is normal caliber. Aortic atherosclerosis. Mediastinum/Nodes: Numerous small and borderline sized mediastinal lymph nodes. Pretracheal  lymph node has a short axis diameter of 9 mm. Other similarly sized or smaller mediastinal lymph nodes. No hilar or axillary adenopathy. Lungs/Pleura: Biapical scarring. 5 mm nodule in the left lower lobe. Linear atelectasis or scarring at the left base. No effusions. Musculoskeletal: Chest wall soft tissues are unremarkable. No acute bony abnormality. CT ABDOMEN PELVIS FINDINGS Hepatobiliary: Innumerable low-density lesions throughout the liver most compatible with metastases. Index right hepatic lesion posteriorly measures 2.5 cm. The other innumerable lesions are similar or smaller in size. Gallbladder grossly  unremarkable. Pancreas: No focal abnormality or ductal dilatation. Spleen: No focal abnormality.  Normal size. Adrenals/Urinary Tract: No adrenal abnormality. No focal renal abnormality. No stones or hydronephrosis. Urinary bladder is unremarkable. Stomach/Bowel: Stomach, large and small bowel grossly unremarkable. Vascular/Lymphatic: Diffuse aortic atherosclerosis. No evidence of aneurysm or adenopathy. Reproductive: Radiation seeds in the region of the prostate. Other: Trace free fluid in the cul-de-sac.  No free air. Musculoskeletal: No acute bony abnormality. IMPRESSION: Innumerable low-density lesions throughout the liver compatible with metastases. Nonspecific borderline mediastinal lymph nodes, 9 mm or less in size. 5 mm left lower lobe pulmonary nodule. Biapical scarring.  Left base scarring or atelectasis. Aortic atherosclerosis. Electronically Signed   By: Rolm Baptise M.D.   On: 06/28/2019 21:32   MR ABDOMEN MRCP W WO CONTAST  Result Date: 06/29/2019 CLINICAL DATA:  85 year old male with history of right upper quadrant abdominal pain for the past 3 weeks. Dark colored urine and light colored stool. Acute hepatitis. Multiple hypovascular hepatic lesions noted on recent CT the abdomen and pelvis. EXAM: MRI ABDOMEN WITHOUT AND WITH CONTRAST (INCLUDING MRCP) TECHNIQUE: Multiplanar multisequence MR imaging of the abdomen was performed both before and after the administration of intravenous contrast. Heavily T2-weighted images of the biliary and pancreatic ducts were obtained, and three-dimensional MRCP images were rendered by post processing. CONTRAST:  70mL GADAVIST GADOBUTROL 1 MMOL/ML IV SOLN COMPARISON:  No prior abdominal MRI. CT the abdomen and pelvis 06/28/2019. FINDINGS: Lower chest: Scarring in the posterior aspect of the left lower lobe, better demonstrated on recent chest CT. Hepatobiliary: Innumerable T1 hypointense, T2 hyperintense hypovascular lesions scattered throughout the hepatic  parenchyma, indicative of widespread metastatic disease. The largest of these lesions is noted in the central aspect of the liver between segments 4A and 8 (axial image 13 of series 8) measuring 3.6 x 2.3 cm. No intra or extrahepatic biliary ductal dilatation. Gallbladder is nearly decompressed. Gallbladder wall appears mildly thickened and edematous. No filling defects in the gallbladder to suggest cholelithiasis. Pancreas: There is a poorly defined mass in the tail of the pancreas which is slightly T1 hypointense, T2 hypointense and appears hypovascular on post gadolinium imaging measuring 3.5 x 2.8 x 2.6 cm (axial image 54 of series 33 and coronal image 35 of series 39). This is intimately associated with the splenic hilum, without definite evidence of direct splenic invasion. No pancreatic ductal dilatation. No pancreatic or peripancreatic fluid collections or inflammatory changes. Spleen: Mass in tail of pancreas intimately associated with the splenic hilum without evidence of direct invasion. Adrenals/Urinary Tract: Subcentimeter T1 hypointense, T2 hyperintense, nonenhancing lesions in both kidneys, compatible with tiny simple cysts. No hydroureteronephrosis in the visualized portions of the abdomen. Bilateral adrenal glands are normal in appearance. Stomach/Bowel: Visualized portions are unremarkable. Vascular/Lymphatic: Aortic atherosclerosis, without evidence of aneurysm in the abdominal vasculature. Lesion in the tail of the pancreas is intimately associated with the splenic artery and vein. Prominent but nonenlarged portacaval lymph node measuring 9 mm in  short axis which demonstrates diffusion restriction. Other: No significant volume of ascites noted in the visualized portions of the peritoneal cavity. Musculoskeletal: No suspicious osseous lesions noted in the visualized portions of the skeleton. IMPRESSION: 1. 3.5 x 2.8 x 2.6 cm hypovascular mass in the tail of the pancreas intimately associated with  the splenic hilum without definite evidence of direct splenic invasion. Correlation with endoscopic ultrasound and biopsy is recommended to establish a tissue diagnosis. 2. Innumerable lesions noted throughout the liver compatible with widespread metastatic disease. 3. Nonenlarged but suspicious 9 mm short axis portacaval lymph node which demonstrates diffusion restriction, likely to represent nodal metastasis. 4. Aortic atherosclerosis. Electronically Signed   By: Vinnie Langton M.D.   On: 06/29/2019 11:16    Scheduled Meds: . chlorhexidine  15 mL Mouth/Throat TID   Continuous Infusions:   LOS: 1 day   Marylu Lund, MD [facility] Pager On Amion  If 7PM-7AM, please contact night-coverage 06/30/2019, 2:44 PM

## 2019-06-30 NOTE — Progress Notes (Signed)
During change of shift report the pt. Notified this Company secretary that he drank water around [place]. Messaged Azucena Freed, PA as pt. Has scheduled biopsy. Per Judson Roch pt. Is still okay for biopsy.

## 2019-06-30 NOTE — Consult Note (Signed)
Chief Complaint: Patient was seen in consultation today for hyperbilirubinemia   Referring Physician(s): Dr. Tarri Glenn  Supervising Physician: Arne Cleveland  Patient Status: [facility] - Out-pt  History of Present Illness: Thomas Riddle is a 85 y.o. male with past medical history of CAD/CABG, trieminal neuralgia, prostate cancer who presented to the ED with RUQ x3 weeks with light-colored stools and dark urine. CT Abdomen showed innumerable low-density lesions throughout the liver compatible with metastases, lymphadenopathy.  Subsequent MR Abdomen showed a mass in the tail of the pancreas. IR consulted for liver lesion biopsy.  Case reviewed by Dr. Vernard Gambles who approves patient for procedure.   Patient assessed at bedside.  He is resting comfortably.  Asks appropriate questions which are answered.  He stable and agreeable to procedure.   Past Medical History:  Diagnosis Date  . CAD (coronary artery disease) 06/28/2019  . Hyperbilirubinemia 06/2019  . Prostate cancer [facility])     Past Surgical History:  Procedure Laterality Date  . APPENDECTOMY    . CORONARY ARTERY BYPASS GRAFT    . HERNIA REPAIR    . RADIOACTIVE SEED IMPLANT N/A    Prostate cancer    Allergies: Amoxicillin sodium and Penicillins  Medications: Prior to Admission medications   Medication Sig Start Date End Date Taking? Authorizing Provider  chlorhexidine (PERIDEX) 0.12 % solution Use as directed 15 mLs in the mouth or throat 3 (three) times daily.   Yes [provider]     Family History  Problem Relation Age of Onset  . Alcohol abuse Father   . Colon cancer Sister   . Lung cancer Brother     Social History   Socioeconomic History  . Marital status: Widowed    Spouse name: Not on file  . Number of children: Not on file  . Years of education: Not on file  . Highest education level: Not on file  Occupational History  . Not on file  Tobacco Use  . Smoking status: Former Research scientist (life sciences)  . Smokeless  tobacco: Never Used  Substance and Sexual Activity  . Alcohol use: Yes  . Drug use: No  . Sexual activity: Not on file  Other Topics Concern  . Not on file  Social History Narrative  . Not on file   Social Determinants of Health   Financial Resource Strain:   . Difficulty of Paying Living Expenses:   Food Insecurity:   . Worried About Charity fundraiser in the Last Year:   . Arboriculturist in the Last Year:   Transportation Needs:   . Film/video editor (Medical):   Marland Kitchen Lack of Transportation (Non-Medical):   Physical Activity:   . Days of Exercise per Week:   . Minutes of Exercise per Session:   Stress:   . Feeling of Stress :   Social Connections:   . Frequency of Communication with Friends and Family:   . Frequency of Social Gatherings with Friends and Family:   . Attends Religious Services:   . Active Member of Clubs or Organizations:   . Attends Archivist Meetings:   Marland Kitchen Marital Status:      Review of Systems: A 12 point ROS discussed and pertinent positives are indicated in the HPI above.  All other systems are negative.  Review of Systems  Constitutional: Negative for fatigue and fever.  Respiratory: Negative for cough and shortness of breath.   Cardiovascular: Negative for chest pain.  Gastrointestinal: Negative for abdominal pain, nausea  and vomiting.  Musculoskeletal: Negative for back pain and gait problem.  Psychiatric/Behavioral: Negative for behavioral problems and confusion.    Vital Signs: BP (!) 147/82 (BP Location: Left Arm)   Pulse 86   Temp 97.8 F (36.6 C) (Oral)   Resp 18   Ht 5\' 9"  (1.753 m)   Wt 163 lb 9.3 oz (74.2 kg)   SpO2 96%   BMI 24.16 kg/m   Physical Exam Vitals and nursing note reviewed.  Constitutional:      General: He is not in acute distress.    Appearance: He is normal weight. He is not ill-appearing.  Eyes:     General: Scleral icterus present.  Cardiovascular:     Rate and Rhythm: Normal rate and  regular rhythm.  Pulmonary:     Effort: Pulmonary effort is normal. No respiratory distress.     Breath sounds: Normal breath sounds.  Abdominal:     General: Abdomen is flat.     Palpations: Abdomen is soft.  Skin:    General: Skin is warm and dry.  Neurological:     General: No focal deficit present.     Mental Status: He is alert.  Psychiatric:        Mood and Affect: Mood normal.        Behavior: Behavior normal.      MD Evaluation Airway: WNL Heart: WNL Abdomen: WNL Chest/ Lungs: WNL ASA  Classification: 3 Mallampati/Airway Score: One   Imaging: DG Chest 2 View  Result Date: 06/28/2019 CLINICAL DATA:  Shortness of breath EXAM: CHEST - 2 VIEW COMPARISON:  None. FINDINGS: Post sternotomy changes. No acute consolidation or effusion. Normal heart size. Aortic atherosclerosis. No pneumothorax. IMPRESSION: No active cardiopulmonary disease. Electronically Signed   By: Donavan Foil M.D.   On: 06/28/2019 17:23   CT Chest W Contrast  Result Date: 06/28/2019 CLINICAL DATA:  Right upper quadrant pain EXAM: CT CHEST, ABDOMEN, AND PELVIS WITH CONTRAST TECHNIQUE: Multidetector CT imaging of the chest, abdomen and pelvis was performed following the standard protocol during bolus administration of intravenous contrast. CONTRAST:  167mL OMNIPAQUE IOHEXOL 300 MG/ML  SOLN COMPARISON:  None. FINDINGS: CT CHEST FINDINGS Cardiovascular: Prior CABG. Heart is normal size. Aorta is normal caliber. Aortic atherosclerosis. Mediastinum/Nodes: Numerous small and borderline sized mediastinal lymph nodes. Pretracheal lymph node has a short axis diameter of 9 mm. Other similarly sized or smaller mediastinal lymph nodes. No hilar or axillary adenopathy. Lungs/Pleura: Biapical scarring. 5 mm nodule in the left lower lobe. Linear atelectasis or scarring at the left base. No effusions. Musculoskeletal: Chest wall soft tissues are unremarkable. No acute bony abnormality. CT ABDOMEN PELVIS FINDINGS Hepatobiliary:  Innumerable low-density lesions throughout the liver most compatible with metastases. Index right hepatic lesion posteriorly measures 2.5 cm. The other innumerable lesions are similar or smaller in size. Gallbladder grossly unremarkable. Pancreas: No focal abnormality or ductal dilatation. Spleen: No focal abnormality.  Normal size. Adrenals/Urinary Tract: No adrenal abnormality. No focal renal abnormality. No stones or hydronephrosis. Urinary bladder is unremarkable. Stomach/Bowel: Stomach, large and small bowel grossly unremarkable. Vascular/Lymphatic: Diffuse aortic atherosclerosis. No evidence of aneurysm or adenopathy. Reproductive: Radiation seeds in the region of the prostate. Other: Trace free fluid in the cul-de-sac.  No free air. Musculoskeletal: No acute bony abnormality. IMPRESSION: Innumerable low-density lesions throughout the liver compatible with metastases. Nonspecific borderline mediastinal lymph nodes, 9 mm or less in size. 5 mm left lower lobe pulmonary nodule. Biapical scarring.  Left base scarring or atelectasis. Aortic  atherosclerosis. Electronically Signed   By: Rolm Baptise M.D.   On: 06/28/2019 21:32   CT ABDOMEN PELVIS W CONTRAST  Result Date: 06/28/2019 CLINICAL DATA:  Right upper quadrant pain EXAM: CT CHEST, ABDOMEN, AND PELVIS WITH CONTRAST TECHNIQUE: Multidetector CT imaging of the chest, abdomen and pelvis was performed following the standard protocol during bolus administration of intravenous contrast. CONTRAST:  153mL OMNIPAQUE IOHEXOL 300 MG/ML  SOLN COMPARISON:  None. FINDINGS: CT CHEST FINDINGS Cardiovascular: Prior CABG. Heart is normal size. Aorta is normal caliber. Aortic atherosclerosis. Mediastinum/Nodes: Numerous small and borderline sized mediastinal lymph nodes. Pretracheal lymph node has a short axis diameter of 9 mm. Other similarly sized or smaller mediastinal lymph nodes. No hilar or axillary adenopathy. Lungs/Pleura: Biapical scarring. 5 mm nodule in the left  lower lobe. Linear atelectasis or scarring at the left base. No effusions. Musculoskeletal: Chest wall soft tissues are unremarkable. No acute bony abnormality. CT ABDOMEN PELVIS FINDINGS Hepatobiliary: Innumerable low-density lesions throughout the liver most compatible with metastases. Index right hepatic lesion posteriorly measures 2.5 cm. The other innumerable lesions are similar or smaller in size. Gallbladder grossly unremarkable. Pancreas: No focal abnormality or ductal dilatation. Spleen: No focal abnormality.  Normal size. Adrenals/Urinary Tract: No adrenal abnormality. No focal renal abnormality. No stones or hydronephrosis. Urinary bladder is unremarkable. Stomach/Bowel: Stomach, large and small bowel grossly unremarkable. Vascular/Lymphatic: Diffuse aortic atherosclerosis. No evidence of aneurysm or adenopathy. Reproductive: Radiation seeds in the region of the prostate. Other: Trace free fluid in the cul-de-sac.  No free air. Musculoskeletal: No acute bony abnormality. IMPRESSION: Innumerable low-density lesions throughout the liver compatible with metastases. Nonspecific borderline mediastinal lymph nodes, 9 mm or less in size. 5 mm left lower lobe pulmonary nodule. Biapical scarring.  Left base scarring or atelectasis. Aortic atherosclerosis. Electronically Signed   By: Rolm Baptise M.D.   On: 06/28/2019 21:32   MR ABDOMEN MRCP W WO CONTAST  Result Date: 06/29/2019 CLINICAL DATA:  85 year old male with history of right upper quadrant abdominal pain for the past 3 weeks. Dark colored urine and light colored stool. Acute hepatitis. Multiple hypovascular hepatic lesions noted on recent CT the abdomen and pelvis. EXAM: MRI ABDOMEN WITHOUT AND WITH CONTRAST (INCLUDING MRCP) TECHNIQUE: Multiplanar multisequence MR imaging of the abdomen was performed both before and after the administration of intravenous contrast. Heavily T2-weighted images of the biliary and pancreatic ducts were obtained, and  three-dimensional MRCP images were rendered by post processing. CONTRAST:  10mL GADAVIST GADOBUTROL 1 MMOL/ML IV SOLN COMPARISON:  No prior abdominal MRI. CT the abdomen and pelvis 06/28/2019. FINDINGS: Lower chest: Scarring in the posterior aspect of the left lower lobe, better demonstrated on recent chest CT. Hepatobiliary: Innumerable T1 hypointense, T2 hyperintense hypovascular lesions scattered throughout the hepatic parenchyma, indicative of widespread metastatic disease. The largest of these lesions is noted in the central aspect of the liver between segments 4A and 8 (axial image 13 of series 8) measuring 3.6 x 2.3 cm. No intra or extrahepatic biliary ductal dilatation. Gallbladder is nearly decompressed. Gallbladder wall appears mildly thickened and edematous. No filling defects in the gallbladder to suggest cholelithiasis. Pancreas: There is a poorly defined mass in the tail of the pancreas which is slightly T1 hypointense, T2 hypointense and appears hypovascular on post gadolinium imaging measuring 3.5 x 2.8 x 2.6 cm (axial image 54 of series 33 and coronal image 35 of series 39). This is intimately associated with the splenic hilum, without definite evidence of direct splenic invasion. No pancreatic ductal  dilatation. No pancreatic or peripancreatic fluid collections or inflammatory changes. Spleen: Mass in tail of pancreas intimately associated with the splenic hilum without evidence of direct invasion. Adrenals/Urinary Tract: Subcentimeter T1 hypointense, T2 hyperintense, nonenhancing lesions in both kidneys, compatible with tiny simple cysts. No hydroureteronephrosis in the visualized portions of the abdomen. Bilateral adrenal glands are normal in appearance. Stomach/Bowel: Visualized portions are unremarkable. Vascular/Lymphatic: Aortic atherosclerosis, without evidence of aneurysm in the abdominal vasculature. Lesion in the tail of the pancreas is intimately associated with the splenic artery and  vein. Prominent but nonenlarged portacaval lymph node measuring 9 mm in short axis which demonstrates diffusion restriction. Other: No significant volume of ascites noted in the visualized portions of the peritoneal cavity. Musculoskeletal: No suspicious osseous lesions noted in the visualized portions of the skeleton. IMPRESSION: 1. 3.5 x 2.8 x 2.6 cm hypovascular mass in the tail of the pancreas intimately associated with the splenic hilum without definite evidence of direct splenic invasion. Correlation with endoscopic ultrasound and biopsy is recommended to establish a tissue diagnosis. 2. Innumerable lesions noted throughout the liver compatible with widespread metastatic disease. 3. Nonenlarged but suspicious 9 mm short axis portacaval lymph node which demonstrates diffusion restriction, likely to represent nodal metastasis. 4. Aortic atherosclerosis. Electronically Signed   By: Vinnie Langton M.D.   On: 06/29/2019 11:16    Labs:  CBC: Recent Labs    06/28/19 1633 06/29/19 0610 06/30/19 0447  WBC 8.5 8.3 9.4  HGB 15.0 14.5 15.0  HCT 44.6 42.7 44.1  PLT 163 138* 163    COAGS: Recent Labs    06/29/19 1017  INR 1.3*    BMP: Recent Labs    06/28/19 1633 06/29/19 0610 06/30/19 0447  NA 133* 134* 136  K 4.4 4.3 3.9  CL 96* 99 100  CO2 27 25 26   GLUCOSE 145* 91 116*  BUN 12 12 14   CALCIUM 9.3 9.2 9.3  CREATININE 1.20 1.08 1.24  GFRNONAA 56* >60 53*  GFRAA >60 >60 >60    LIVER FUNCTION TESTS: Recent Labs    06/28/19 1633 06/29/19 0610 06/30/19 0447  BILITOT 5.7* 7.1* 6.5*  AST 144* 135* 155*  ALT 96* 88* 98*  ALKPHOS 406* 380* 407*  PROT 7.4 6.5 6.5  ALBUMIN 3.1* 2.8* 2.8*    TUMOR MARKERS: No results for input(s): AFPTM, CEA, CA199, CHROMGRNA in the last 8760 hours.  Assessment and Plan: Hyperbilirubinemia, juandice in the setting of multiple metastatic lesions in the liver, pancreatic tail mass. IR consulted for liver lesion biopsy.  Tbili 6.5 Mild  scleral icterus.  Case reviewed and approved by Dr. Vernard Gambles.  Plan for liver biopsy in [place] tomorrow as schedule allows.  NPO p MN tonight.   Risks and benefits of biopsy was discussed with the patient and/or patient's family including, but not limited to bleeding, infection, damage to adjacent structures or low yield requiring additional tests.  All of the questions were answered and there is agreement to proceed.  Consent signed and in chart.  Thank you for this interesting consult.  I greatly enjoyed meeting Thomas Riddle and look forward to participating in their care.  A copy of this report was sent to the requesting provider on this date.  Electronically Signed: Docia Barrier, PA 06/30/2019, 3:38 PM   I spent a total of 40 Minutes    in face to face in clinical consultation, greater than 50% of which was counseling/coordinating care for liver lesions.

## 2019-06-30 NOTE — Progress Notes (Signed)
Daily Rounding Note  06/30/2019, 10:54 AM  LOS: 1 day   SUBJECTIVE:   Chief complaint:  Jaundice, liver lesions/mets,    Ongoing mild to moderate abdominal pain. Doesn't feel like he needs pain meds for this. 2 bowel movements yesterday. Slept a few hours last night but woke up at about 4 AM and wasn't able to go back to sleep, had a little bit of a nap midmorning.  OBJECTIVE:         Vital signs in last 24 hours:    Temp:  [97.6 F (36.4 C)-98.4 F (36.9 C)] 97.7 F (36.5 C) (05/05 0742) Pulse Rate:  [69-94] 87 (05/05 0742) Resp:  [15-20] 18 (05/05 0742) BP: (128-157)/(62-86) 152/62 (05/05 0742) SpO2:  [93 %-98 %] 98 % (05/05 0742) Weight:  [74.2 kg] 74.2 kg (05/04 1954) Last BM Date: 06/28/19 Filed Weights   06/29/19 1954  Weight: 74.2 kg   General: Looks well, his jaundice looks more like a suntanned. Heart: RRR. Chest: No labored breathing, no cough. Lungs clear Abdomen: Soft. Nontender. Active bowel sounds. No distention. Extremities: No CCE. Neuro/Psych: Appropriate. Alert and oriented x3. No deficits or weakness. No tremors.  Intake/Output from previous day: 05/04 0701 - 05/05 0700 In: 240 [P.O.:240] Out: -   Intake/Output this shift: No intake/output data recorded.  Lab Results: Recent Labs    06/28/19 1633 06/29/19 0610 06/30/19 0447  WBC 8.5 8.3 9.4  HGB 15.0 14.5 15.0  HCT 44.6 42.7 44.1  PLT 163 138* 163   BMET Recent Labs    06/28/19 1633 06/29/19 0610 06/30/19 0447  NA 133* 134* 136  K 4.4 4.3 3.9  CL 96* 99 100  CO2 27 25 26   GLUCOSE 145* 91 116*  BUN 12 12 14   CREATININE 1.20 1.08 1.24  CALCIUM 9.3 9.2 9.3   LFT Recent Labs    06/28/19 1633 06/29/19 0610 06/30/19 0447  PROT 7.4 6.5 6.5  ALBUMIN 3.1* 2.8* 2.8*  AST 144* 135* 155*  ALT 96* 88* 98*  ALKPHOS 406* 380* 407*  BILITOT 5.7* 7.1* 6.5*  BILIDIR  --  4.3*  --   IBILI  --  2.8*  --    PT/INR Recent Labs      06/29/19 1017  LABPROT 15.8*  INR 1.3*   Hepatitis Panel No results for input(s): HEPBSAG, HCVAB, HEPAIGM, HEPBIGM in the last 72 hours.  Studies/Results: DG Chest 2 View  Result Date: 06/28/2019 CLINICAL DATA:  Shortness of breath EXAM: CHEST - 2 VIEW COMPARISON:  None. FINDINGS: Post sternotomy changes. No acute consolidation or effusion. Normal heart size. Aortic atherosclerosis. No pneumothorax. IMPRESSION: No active cardiopulmonary disease. Electronically Signed   By: Donavan Foil M.D.   On: 06/28/2019 17:23   CT Chest W Contrast  Result Date: 06/28/2019 CLINICAL DATA:  Right upper quadrant pain EXAM: CT CHEST, ABDOMEN, AND PELVIS WITH CONTRAST TECHNIQUE: Multidetector CT imaging of the chest, abdomen and pelvis was performed following the standard protocol during bolus administration of intravenous contrast. CONTRAST:  126mL OMNIPAQUE IOHEXOL 300 MG/ML  SOLN COMPARISON:  None. FINDINGS: CT CHEST FINDINGS Cardiovascular: Prior CABG. Heart is normal size. Aorta is normal caliber. Aortic atherosclerosis. Mediastinum/Nodes: Numerous small and borderline sized mediastinal lymph nodes. Pretracheal lymph node has a short axis diameter of 9 mm. Other similarly sized or smaller mediastinal lymph nodes. No hilar or axillary adenopathy. Lungs/Pleura: Biapical scarring. 5 mm nodule in the left lower lobe. Linear atelectasis or  scarring at the left base. No effusions. Musculoskeletal: Chest wall soft tissues are unremarkable. No acute bony abnormality. CT ABDOMEN PELVIS FINDINGS Hepatobiliary: Innumerable low-density lesions throughout the liver most compatible with metastases. Index right hepatic lesion posteriorly measures 2.5 cm. The other innumerable lesions are similar or smaller in size. Gallbladder grossly unremarkable. Pancreas: No focal abnormality or ductal dilatation. Spleen: No focal abnormality.  Normal size. Adrenals/Urinary Tract: No adrenal abnormality. No focal renal abnormality. No  stones or hydronephrosis. Urinary bladder is unremarkable. Stomach/Bowel: Stomach, large and small bowel grossly unremarkable. Vascular/Lymphatic: Diffuse aortic atherosclerosis. No evidence of aneurysm or adenopathy. Reproductive: Radiation seeds in the region of the prostate. Other: Trace free fluid in the cul-de-sac.  No free air. Musculoskeletal: No acute bony abnormality. IMPRESSION: Innumerable low-density lesions throughout the liver compatible with metastases. Nonspecific borderline mediastinal lymph nodes, 9 mm or less in size. 5 mm left lower lobe pulmonary nodule. Biapical scarring.  Left base scarring or atelectasis. Aortic atherosclerosis. Electronically Signed   By: Rolm Baptise M.D.   On: 06/28/2019 21:32   CT ABDOMEN PELVIS W CONTRAST  Result Date: 06/28/2019 CLINICAL DATA:  Right upper quadrant pain EXAM: CT CHEST, ABDOMEN, AND PELVIS WITH CONTRAST TECHNIQUE: Multidetector CT imaging of the chest, abdomen and pelvis was performed following the standard protocol during bolus administration of intravenous contrast. CONTRAST:  169mL OMNIPAQUE IOHEXOL 300 MG/ML  SOLN COMPARISON:  None. FINDINGS: CT CHEST FINDINGS Cardiovascular: Prior CABG. Heart is normal size. Aorta is normal caliber. Aortic atherosclerosis. Mediastinum/Nodes: Numerous small and borderline sized mediastinal lymph nodes. Pretracheal lymph node has a short axis diameter of 9 mm. Other similarly sized or smaller mediastinal lymph nodes. No hilar or axillary adenopathy. Lungs/Pleura: Biapical scarring. 5 mm nodule in the left lower lobe. Linear atelectasis or scarring at the left base. No effusions. Musculoskeletal: Chest wall soft tissues are unremarkable. No acute bony abnormality. CT ABDOMEN PELVIS FINDINGS Hepatobiliary: Innumerable low-density lesions throughout the liver most compatible with metastases. Index right hepatic lesion posteriorly measures 2.5 cm. The other innumerable lesions are similar or smaller in size.  Gallbladder grossly unremarkable. Pancreas: No focal abnormality or ductal dilatation. Spleen: No focal abnormality.  Normal size. Adrenals/Urinary Tract: No adrenal abnormality. No focal renal abnormality. No stones or hydronephrosis. Urinary bladder is unremarkable. Stomach/Bowel: Stomach, large and small bowel grossly unremarkable. Vascular/Lymphatic: Diffuse aortic atherosclerosis. No evidence of aneurysm or adenopathy. Reproductive: Radiation seeds in the region of the prostate. Other: Trace free fluid in the cul-de-sac.  No free air. Musculoskeletal: No acute bony abnormality. IMPRESSION: Innumerable low-density lesions throughout the liver compatible with metastases. Nonspecific borderline mediastinal lymph nodes, 9 mm or less in size. 5 mm left lower lobe pulmonary nodule. Biapical scarring.  Left base scarring or atelectasis. Aortic atherosclerosis. Electronically Signed   By: Rolm Baptise M.D.   On: 06/28/2019 21:32   MR ABDOMEN MRCP W WO CONTAST  Result Date: 06/29/2019 CLINICAL DATA:  85 year old male with history of right upper quadrant abdominal pain for the past 3 weeks. Dark colored urine and light colored stool. Acute hepatitis. Multiple hypovascular hepatic lesions noted on recent CT the abdomen and pelvis. EXAM: MRI ABDOMEN WITHOUT AND WITH CONTRAST (INCLUDING MRCP) TECHNIQUE: Multiplanar multisequence MR imaging of the abdomen was performed both before and after the administration of intravenous contrast. Heavily T2-weighted images of the biliary and pancreatic ducts were obtained, and three-dimensional MRCP images were rendered by post processing. CONTRAST:  22mL GADAVIST GADOBUTROL 1 MMOL/ML IV SOLN COMPARISON:  No prior abdominal MRI.  CT the abdomen and pelvis 06/28/2019. FINDINGS: Lower chest: Scarring in the posterior aspect of the left lower lobe, better demonstrated on recent chest CT. Hepatobiliary: Innumerable T1 hypointense, T2 hyperintense hypovascular lesions scattered throughout  the hepatic parenchyma, indicative of widespread metastatic disease. The largest of these lesions is noted in the central aspect of the liver between segments 4A and 8 (axial image 13 of series 8) measuring 3.6 x 2.3 cm. No intra or extrahepatic biliary ductal dilatation. Gallbladder is nearly decompressed. Gallbladder wall appears mildly thickened and edematous. No filling defects in the gallbladder to suggest cholelithiasis. Pancreas: There is a poorly defined mass in the tail of the pancreas which is slightly T1 hypointense, T2 hypointense and appears hypovascular on post gadolinium imaging measuring 3.5 x 2.8 x 2.6 cm (axial image 54 of series 33 and coronal image 35 of series 39). This is intimately associated with the splenic hilum, without definite evidence of direct splenic invasion. No pancreatic ductal dilatation. No pancreatic or peripancreatic fluid collections or inflammatory changes. Spleen: Mass in tail of pancreas intimately associated with the splenic hilum without evidence of direct invasion. Adrenals/Urinary Tract: Subcentimeter T1 hypointense, T2 hyperintense, nonenhancing lesions in both kidneys, compatible with tiny simple cysts. No hydroureteronephrosis in the visualized portions of the abdomen. Bilateral adrenal glands are normal in appearance. Stomach/Bowel: Visualized portions are unremarkable. Vascular/Lymphatic: Aortic atherosclerosis, without evidence of aneurysm in the abdominal vasculature. Lesion in the tail of the pancreas is intimately associated with the splenic artery and vein. Prominent but nonenlarged portacaval lymph node measuring 9 mm in short axis which demonstrates diffusion restriction. Other: No significant volume of ascites noted in the visualized portions of the peritoneal cavity. Musculoskeletal: No suspicious osseous lesions noted in the visualized portions of the skeleton. IMPRESSION: 1. 3.5 x 2.8 x 2.6 cm hypovascular mass in the tail of the pancreas intimately  associated with the splenic hilum without definite evidence of direct splenic invasion. Correlation with endoscopic ultrasound and biopsy is recommended to establish a tissue diagnosis. 2. Innumerable lesions noted throughout the liver compatible with widespread metastatic disease. 3. Nonenlarged but suspicious 9 mm short axis portacaval lymph node which demonstrates diffusion restriction, likely to represent nodal metastasis. 4. Aortic atherosclerosis. Electronically Signed   By: Vinnie Langton M.D.   On: 06/29/2019 11:16    ASSESMENT:   *    Jaundice with metastatic lesions in the liver per CT and MR. Primary source not yet determined but may be pancreatic given lesion in tail on CT.  INR 1.3.  CEA 61.    *   Hx prostate cancer treated with radiation seeds and external radiation ~2000.  *    Hx colon polyp of unclear type.  Aged out of surveillance colonoscopy.  Last performed 5 or more years ago at the [place].  *    Mild hyponatremia.    PLAN   *    Liver biopsy ordered, confirmed that he is on the schedule for today. N.p.o. until after this is completed..  If confirms type of cancer, does not need EUS which might be challenging given location of pancreatic lesion.      Azucena Freed  06/30/2019, 10:54 AM Phone [phone]

## 2019-07-01 ENCOUNTER — Other Ambulatory Visit: Payer: Self-pay

## 2019-07-01 ENCOUNTER — Inpatient Hospital Stay (HOSPITAL_COMMUNITY): Payer: No Typology Code available for payment source

## 2019-07-01 DIAGNOSIS — R739 Hyperglycemia, unspecified: Secondary | ICD-10-CM

## 2019-07-01 DIAGNOSIS — K8689 Other specified diseases of pancreas: Secondary | ICD-10-CM | POA: Diagnosis not present

## 2019-07-01 DIAGNOSIS — R160 Hepatomegaly, not elsewhere classified: Secondary | ICD-10-CM | POA: Diagnosis not present

## 2019-07-01 LAB — COMPREHENSIVE METABOLIC PANEL
ALT: 89 U/L — ABNORMAL HIGH (ref 0–44)
AST: 154 U/L — ABNORMAL HIGH (ref 15–41)
Albumin: 2.6 g/dL — ABNORMAL LOW (ref 3.5–5.0)
Alkaline Phosphatase: 365 U/L — ABNORMAL HIGH (ref 38–126)
Anion gap: 7 (ref 5–15)
BUN: 15 mg/dL (ref 8–23)
CO2: 26 mmol/L (ref 22–32)
Calcium: 8.4 mg/dL — ABNORMAL LOW (ref 8.9–10.3)
Chloride: 101 mmol/L (ref 98–111)
Creatinine, Ser: 1.21 mg/dL (ref 0.61–1.24)
GFR calc Af Amer: >60 mL/min (ref >60)
GFR calc non Af Amer: 55 mL/min — ABNORMAL LOW (ref >60)
Glucose, Bld: 136 mg/dL — ABNORMAL HIGH (ref 70–99)
Potassium: 4 mmol/L (ref 3.5–5.1)
Sodium: 134 mmol/L — ABNORMAL LOW (ref 135–145)
Total Bilirubin: 5.6 mg/dL — ABNORMAL HIGH (ref 0.3–1.2)
Total Protein: 6.3 g/dL — ABNORMAL LOW (ref 6.5–8.1)

## 2019-07-01 LAB — CBC WITH DIFFERENTIAL/PLATELET
Abs Immature Granulocytes: 0.09 10*3/uL — ABNORMAL HIGH (ref 0.00–0.07)
Basophils Absolute: 0.1 10*3/uL (ref 0.0–0.1)
Basophils Relative: 1 %
Eosinophils Absolute: 0.3 10*3/uL (ref 0.0–0.5)
Eosinophils Relative: 4 %
HCT: 40.3 % (ref 39.0–52.0)
Hemoglobin: 13.8 g/dL (ref 13.0–17.0)
Immature Granulocytes: 1 %
Lymphocytes Relative: 15 %
Lymphs Abs: 1.2 10*3/uL (ref 0.7–4.0)
MCH: 31.8 pg (ref 26.0–34.0)
MCHC: 34.2 g/dL (ref 30.0–36.0)
MCV: 92.9 fL (ref 80.0–100.0)
Monocytes Absolute: 1.2 10*3/uL — ABNORMAL HIGH (ref 0.1–1.0)
Monocytes Relative: 14 %
Neutro Abs: 5.6 10*3/uL (ref 1.7–7.7)
Neutrophils Relative %: 65 %
Platelets: 162 10*3/uL (ref 150–400)
RBC: 4.34 MIL/uL (ref 4.22–5.81)
RDW: 15.3 % (ref 11.5–15.5)
WBC: 8.5 10*3/uL (ref 4.0–10.5)
nRBC: 0 % (ref 0.0–0.2)

## 2019-07-01 LAB — CANCER ANTIGEN 19-9: CA 19-9: 10000 U/mL — ABNORMAL HIGH (ref 0–35)

## 2019-07-01 MED ORDER — BISACODYL 10 MG RE SUPP
10.0000 mg | Freq: Once | RECTAL | Status: DC
  Filled 2019-07-01: qty 1

## 2019-07-01 MED ORDER — MIDAZOLAM HCL 2 MG/2ML IJ SOLN
INTRAMUSCULAR | Status: AC
  Filled 2019-07-01: qty 2

## 2019-07-01 MED ORDER — BISACODYL 10 MG RE SUPP: 10.0000 mg | Freq: Every day | RECTAL | Status: DC | PRN

## 2019-07-01 MED ORDER — OXYCODONE HCL 5 MG PO TABS
5.0000 mg | ORAL_TABLET | ORAL | Status: DC | PRN
  Administered 2019-07-01 (×2): 5 mg via ORAL
  Filled 2019-07-01 (×3): qty 1

## 2019-07-01 MED ORDER — LIDOCAINE HCL (PF) 1 % IJ SOLN
INTRAMUSCULAR | Status: AC
  Filled 2019-07-01: qty 30

## 2019-07-01 MED ORDER — FENTANYL CITRATE (PF) 100 MCG/2ML IJ SOLN
INTRAMUSCULAR | Status: AC
  Filled 2019-07-01: qty 2

## 2019-07-01 MED ORDER — PANTOPRAZOLE SODIUM 40 MG PO TBEC
40.0000 mg | DELAYED_RELEASE_TABLET | Freq: Every day | ORAL | Status: DC
  Administered 2019-07-01 – 2019-07-02 (×2): 40 mg via ORAL
  Filled 2019-07-01 (×2): qty 1

## 2019-07-01 MED ORDER — FENTANYL CITRATE (PF) 100 MCG/2ML IJ SOLN
INTRAMUSCULAR | Status: AC | PRN
  Administered 2019-07-01: 50 ug via INTRAVENOUS

## 2019-07-01 MED ORDER — MORPHINE SULFATE (PF) 2 MG/ML IV SOLN: 1.0000 mg | INTRAVENOUS | Status: DC | PRN

## 2019-07-01 MED ORDER — GELATIN ABSORBABLE 12-7 MM EX MISC
CUTANEOUS | Status: AC
  Filled 2019-07-01: qty 1

## 2019-07-01 MED ORDER — MIDAZOLAM HCL 2 MG/2ML IJ SOLN
INTRAMUSCULAR | Status: AC | PRN
  Administered 2019-07-01: 1 mg via INTRAVENOUS

## 2019-07-01 NOTE — Progress Notes (Signed)
Pt refusing to be complaint with safety measures.  Pt getting up repeatedly without calling or waiting for assistance.  NT has educated pt several times.  Pt is refusing for [place] to turn on bed alarm at this time. Pt educated and is still refusing alarm.  Charge RN aware of patient refusing.

## 2019-07-01 NOTE — Progress Notes (Signed)
PROGRESS NOTE    TELL DEMCHAK  [id number] DOB: 11-Jan-1935 DOA: 06/28/2019 PCP: System, Pcp Not In    Brief Narrative:  85 y.o.malewith medical history significant ofCAD/CABG,trigeminal neuralgia,prostate cancerwith radioactive seed implant who is coming to the emergency department with complaints of RUQ pain for the past 3 weeks associated with light-colored stools and dark color urine. In the ED, VS somewhat stable except for BP 181/76 mmHg, labs showed urinalysis with proteinuria 30 mg/dL and small bilirubinuria, Lipase was normal. CMP showed total bilirubin 5.7 mg/dL. Total protein 7.4 and albumin 3.1 g/dL. AST 144, ALT 96 and alkaline phosphatase 406 units/L. CT chest/abdomen with contrast showed numerous low-density lesions throughout the liver compatible with metastasis. Nonspecific borderline mediastinal lymph nodes, 9 mm or less in size. There is a 5 mm left lower lobe pulmonary nodule. GI consulted for further management.  Assessment & Plan:   Principal Problem:   Hyperbilirubinemia Active Problems:   Hyperglycemia   CAD (coronary artery disease)   Liver lesions  Abdominal pain/jaundice/hyperbilirubinemia Likely 2/2 metastatic disease, unknown primary, ?? Mass in the tail of pancrease T bili 5.7 on presentation, afebrile, with no leukocytosis CT chest/abdomen with contrast showed numerous low-density lesions throughout the liver compatible with metastasis. Nonspecific borderline mediastinal lymph nodes, 9 mm or less in size. There is a 5 mm left lower lobe pulmonary nodule MRI abdomen showed 3.5 x 2.8 x 2.6 cm hypovascular mass in the tail of the pancreas.   GI on board, appreciate recs. Pt now s/p US guided biopsy 5/6, awaiting results of pathology Cont with pain management, anti-emetics Will repeat cmp in AM  CAD Presently chest pain-free  Pain control Patient still requiring IV morphine for analgesia Will try to wean to PO narcotic, keeping IV morphine  only for severe pain  DVT prophylaxis: SCD's Code Status: Full Family Communication: Pt in room, family not at bedside  Status is: Inpatient  Remains inpatient appropriate because:Ongoing diagnostic testing needed not appropriate for outpatient work up   Dispo: The patient is from: Home              Anticipated d/c is to: Home              Anticipated d/c date is: 1 day              Patient currently is not medically stable to d/c.  Consultants:   GI  Procedures:   US guided liver biopsy  Antimicrobials: Anti-infectives (From admission, onward)   None      Subjective: Still complaining of abd discomfort, requiring  Objective: Vitals:   07/01/19 1018 07/01/19 1034 07/01/19 1104 07/01/19 1130  BP: (!) 145/72 (!) 130/99 132/64 131/63  Pulse: 84 79 78 84  Resp: 18 18 18 18   Temp:      TempSrc:      SpO2: 98% 95% 96% 96%  Weight:      Height:        Intake/Output Summary (Last 24 hours) at 07/01/2019 1350 Last data filed at 06/30/2019 1758 Gross per 24 hour  Intake 200 ml  Output --  Net 200 ml   Filed Weights   06/29/19 1954  Weight: 74.2 kg    Examination: General exam: Awake, laying in bed, in nad Respiratory system: Normal respiratory effort, no wheezing Cardiovascular system: regular rate, s1, s2 Gastrointestinal system: mildly distended, decreased BS Central nervous system: CN2-12 grossly intact, strength intact Extremities: Perfused, no clubbing Skin: Normal skin turgor, no notable skin lesions  seen Psychiatry: Mood normal // no visual hallucinations   Data Reviewed: I have personally reviewed following labs and imaging studies  CBC: Recent Labs  Lab 06/28/19 1633 06/29/19 0610 06/30/19 0447 07/01/19 0444  WBC 8.5 8.3 9.4 8.5  NEUTROABS  --  5.3 6.3 5.6  HGB 15.0 14.5 15.0 13.8  HCT 44.6 42.7 44.1 40.3  MCV 94.5 94.1 93.2 92.9  PLT 163 138* 163 0000000   Basic Metabolic Panel: Recent Labs  Lab 06/28/19 1633 06/29/19 0610  06/30/19 0447 07/01/19 0444  NA 133* 134* 136 134*  K 4.4 4.3 3.9 4.0  CL 96* 99 100 101  CO2 27 25 26 26   GLUCOSE 145* 91 116* 136*  BUN 12 12 14 15   CREATININE 1.20 1.08 1.24 1.21  CALCIUM 9.3 9.2 9.3 8.4*   GFR: Estimated Creatinine Clearance: 46.3 mL/min (by C-G formula based on SCr of 1.21 mg/dL). Liver Function Tests: Recent Labs  Lab 06/28/19 1633 06/29/19 0610 06/30/19 0447 07/01/19 0444  AST 144* 135* 155* 154*  ALT 96* 88* 98* 89*  ALKPHOS 406* 380* 407* 365*  BILITOT 5.7* 7.1* 6.5* 5.6*  PROT 7.4 6.5 6.5 6.3*  ALBUMIN 3.1* 2.8* 2.8* 2.6*   Recent Labs  Lab 06/28/19 1633  LIPASE 38   No results for input(s): AMMONIA in the last 168 hours. Coagulation Profile: Recent Labs  Lab 06/29/19 1017  INR 1.3*   Cardiac Enzymes: No results for input(s): CKTOTAL, CKMB, CKMBINDEX, TROPONINI in the last 168 hours. BNP (last 3 results) No results for input(s): PROBNP in the last 8760 hours. HbA1C: No results for input(s): HGBA1C in the last 72 hours. CBG: No results for input(s): GLUCAP in the last 168 hours. Lipid Profile: No results for input(s): CHOL, HDL, LDLCALC, TRIG, CHOLHDL, LDLDIRECT in the last 72 hours. Thyroid Function Tests: No results for input(s): TSH, T4TOTAL, FREET4, T3FREE, THYROIDAB in the last 72 hours. Anemia Panel: No results for input(s): VITAMINB12, FOLATE, FERRITIN, TIBC, IRON, RETICCTPCT in the last 72 hours. Sepsis Labs: No results for input(s): PROCALCITON, LATICACIDVEN in the last 168 hours.  Recent Results (from the past 240 hour(s))  Respiratory Panel by RT PCR (Flu A&B, Covid) - Nasopharyngeal Swab     Status: None   Collection Time: 06/28/19 10:01 PM   Specimen: Nasopharyngeal Swab  Result Value Ref Range Status   SARS Coronavirus 2 by RT PCR NEGATIVE NEGATIVE Final    Comment: (NOTE) SARS-CoV-2 target nucleic acids are NOT DETECTED. The SARS-CoV-2 RNA is generally detectable in upper respiratoy specimens during the acute  phase of infection. The lowest concentration of SARS-CoV-2 viral copies this assay can detect is 131 copies/mL. A negative result does not preclude SARS-Cov-2 infection and should not be used as the sole basis for treatment or other patient management decisions. A negative result may occur with  improper specimen collection/handling, submission of specimen other than nasopharyngeal swab, presence of viral mutation(s) within the areas targeted by this assay, and inadequate number of viral copies (<131 copies/mL). A negative result must be combined with clinical observations, patient history, and epidemiological information. The expected result is Negative. Fact Sheet for Patients:  PinkCheek.be Fact Sheet for Healthcare Providers:  GravelBags.it This test is not yet ap proved or cleared by the [place] FDA and  has been authorized for detection and/or diagnosis of SARS-CoV-2 by FDA under an Emergency Use Authorization (EUA). This EUA will remain  in effect (meaning this test can be used) for the duration of the COVID-19  declaration under Section 564(b)(1) of the Act, 21 U.S.C. section 360bbb-3(b)(1), unless the authorization is terminated or revoked sooner.    Influenza A by PCR NEGATIVE NEGATIVE Final   Influenza B by PCR NEGATIVE NEGATIVE Final    Comment: (NOTE) The Xpert Xpress SARS-CoV-2/FLU/RSV assay is intended as an aid in  the diagnosis of influenza from Nasopharyngeal swab specimens and  should not be used as a sole basis for treatment. Nasal washings and  aspirates are unacceptable for Xpert Xpress SARS-CoV-2/FLU/RSV  testing. Fact Sheet for Patients: PinkCheek.be Fact Sheet for Healthcare Providers: GravelBags.it This test is not yet approved or cleared by the [place] FDA and  has been authorized for detection and/or diagnosis of SARS-CoV-2 by   FDA under an Emergency Use Authorization (EUA). This EUA will remain  in effect (meaning this test can be used) for the duration of the  Covid-19 declaration under Section 564(b)(1) of the Act, 21  U.S.C. section 360bbb-3(b)(1), unless the authorization is  terminated or revoked. Performed at [facility], [place] [address], [place], [place] [address]      Radiology Studies: No results found.  Scheduled Meds: . bisacodyl  10 mg Rectal Once  . chlorhexidine  15 mL Mouth/Throat TID  . fentaNYL      . gelatin adsorbable      . lidocaine (PF)      . midazolam      . pantoprazole  40 mg Oral Daily   Continuous Infusions:   LOS: 2 days   Marylu Lund, MD [facility] Pager On Amion  If 7PM-7AM, please contact night-coverage 07/01/2019, 1:50 PM

## 2019-07-01 NOTE — Procedures (Signed)
Interventional Radiology Procedure Note  Procedure: US guided liver lesion biopsy   Complications: None  Estimated Blood Loss: None  Recommendations: - Path sent - Bedrest x 2 hrs   Signed,  Criselda Peaches, MD

## 2019-07-01 NOTE — Progress Notes (Addendum)
Pt feels well.    Liver lesions, mets, jaundice from tumor burden.  ? If primary is in tail of pancreas.  No evidence of ductal dilation/obstruction so no role for stenting. Underwent liver bx this AM.   LFTs remain elevated.   Await path report, hopefully will result by tmrw, Friday, afternoon.   Pt ok from GI view to discharge home, GI can contact him w path results and make outpt oncology referral.  The location of pancreatic mass would not be easy to access with EUS. No need to fup LFTs as they will remain elevated.    Azucena Freed PA-C

## 2019-07-02 LAB — CBC WITH DIFFERENTIAL/PLATELET
Abs Immature Granulocytes: 0.07 10*3/uL (ref 0.00–0.07)
Basophils Absolute: 0 10*3/uL (ref 0.0–0.1)
Basophils Relative: 1 %
Eosinophils Absolute: 0.4 10*3/uL (ref 0.0–0.5)
Eosinophils Relative: 5 %
HCT: 39 % (ref 39.0–52.0)
Hemoglobin: 13.3 g/dL (ref 13.0–17.0)
Immature Granulocytes: 1 %
Lymphocytes Relative: 17 %
Lymphs Abs: 1.3 10*3/uL (ref 0.7–4.0)
MCH: 31.8 pg (ref 26.0–34.0)
MCHC: 34.1 g/dL (ref 30.0–36.0)
MCV: 93.3 fL (ref 80.0–100.0)
Monocytes Absolute: 1.1 10*3/uL — ABNORMAL HIGH (ref 0.1–1.0)
Monocytes Relative: 15 %
Neutro Abs: 4.5 10*3/uL (ref 1.7–7.7)
Neutrophils Relative %: 61 %
Platelets: 148 10*3/uL — ABNORMAL LOW (ref 150–400)
RBC: 4.18 MIL/uL — ABNORMAL LOW (ref 4.22–5.81)
RDW: 15.5 % (ref 11.5–15.5)
WBC: 7.4 10*3/uL (ref 4.0–10.5)
nRBC: 0 % (ref 0.0–0.2)

## 2019-07-02 LAB — COMPREHENSIVE METABOLIC PANEL
ALT: 91 U/L — ABNORMAL HIGH (ref 0–44)
AST: 155 U/L — ABNORMAL HIGH (ref 15–41)
Albumin: 2.4 g/dL — ABNORMAL LOW (ref 3.5–5.0)
Alkaline Phosphatase: 341 U/L — ABNORMAL HIGH (ref 38–126)
Anion gap: 8 (ref 5–15)
BUN: 10 mg/dL (ref 8–23)
CO2: 25 mmol/L (ref 22–32)
Calcium: 8.3 mg/dL — ABNORMAL LOW (ref 8.9–10.3)
Chloride: 100 mmol/L (ref 98–111)
Creatinine, Ser: 0.99 mg/dL (ref 0.61–1.24)
GFR calc Af Amer: >60 mL/min (ref >60)
GFR calc non Af Amer: >60 mL/min (ref >60)
Glucose, Bld: 117 mg/dL — ABNORMAL HIGH (ref 70–99)
Potassium: 3.9 mmol/L (ref 3.5–5.1)
Sodium: 133 mmol/L — ABNORMAL LOW (ref 135–145)
Total Bilirubin: 7.6 mg/dL — ABNORMAL HIGH (ref 0.3–1.2)
Total Protein: 6 g/dL — ABNORMAL LOW (ref 6.5–8.1)

## 2019-07-02 MED ORDER — DOCUSATE SODIUM 100 MG PO CAPS
100.0000 mg | ORAL_CAPSULE | Freq: Two times a day (BID) | ORAL | 0 refills | Status: DC
Start: 2019-07-02 — End: 2019-07-12

## 2019-07-02 MED ORDER — PANTOPRAZOLE SODIUM 40 MG PO TBEC: 40.0000 mg | DELAYED_RELEASE_TABLET | Freq: Every day | ORAL | 0 refills | Status: DC

## 2019-07-02 MED ORDER — OXYCODONE HCL 5 MG PO TABS: 5.0000 mg | ORAL_TABLET | ORAL | 0 refills | Status: DC | PRN

## 2019-07-02 MED FILL — oxyCODONE HCL 5 MG TABS: 5 | 4 days supply | Qty: 20 | Fill #0

## 2019-07-02 MED FILL — DOK 100 MG CAPS: 100 | 30 days supply | Qty: 60 | Fill #0

## 2019-07-02 MED FILL — PANTOPRAZOLE SOD DR 40 MG T: 40 | 30 days supply | Qty: 30 | Fill #0

## 2019-07-02 NOTE — Discharge Summary (Signed)
Physician Discharge Summary  Thomas Riddle [id number] DOB: 1934/05/10 DOA: 06/28/2019  PCP: System, Pcp Not In  Admit date: 06/28/2019 Discharge date: 07/02/2019  Admitted From: Home Disposition:  Home  Recommendations for Outpatient Follow-up:  1. Follow up with PCP in 2-3 weeks 2. Follow up with [facility] 3. Recommend outpatient Oncology referral once pathology returns  [place] reviewed. No recent controlled substances listed. Most recent was for 4 day supply of hydrocodone-acetaminophen on 05/27/19  Discharge Condition:Stable CODE STATUS:Full Diet recommendation: Regular   Brief/Interim Summary: 86 y.o.malewith medical history significant ofCAD/CABG,trigeminal neuralgia,prostate cancerwith radioactive seed implant who is coming to the emergency department with complaints of RUQ pain for the past 3 weeks associated with light-colored stools and dark color urine.In the ED, VS somewhat stable except forBP 181/76 mmHg, labs showed urinalysiswithproteinuria 30 mg/dL and small bilirubinuria,Lipase was normal. CMPshowedtotal bilirubin 5.7 mg/dL. Total protein 7.4 and albumin 3.1 g/dL. AST 144, ALT 96 and alkaline phosphatase 406 units/L. CT chest/abdomen with contrast showed numerous low-density lesions throughout the liver compatible with metastasis. Nonspecific borderline mediastinal lymph nodes, 9 mm or less in size. There is a 5 mm left lower lobe pulmonary nodule.GI consulted for further management.  Discharge Diagnoses:  Principal Problem:   Hyperbilirubinemia Active Problems:   Hyperglycemia   CAD (coronary artery disease)   Liver masses   Pancreatic mass  Abdominal pain/jaundice/hyperbilirubinemia Likely 2/2 metastatic disease, unknown primary, ?? Mass in the tail of pancrease T bili 5.7 on presentation,afebrile, with no leukocytosis CT chest/abdomen with contrast showed numerous low-density lesions throughout the liver compatible with metastasis. Nonspecific  borderline mediastinal lymph nodes, 9 mm or less in size. There is a 5 mm left lower lobe pulmonary nodule MRI abdomen showed3.5 x 2.8 x 2.6 cm hypovascular mass in the tail of the pancreas. GI on board, appreciate recs. Pt now s/p US guided biopsy 5/6, awaiting results of pathology. GI to follow up on path results Cont with pain management. Given concerns of metastatic disease causing pain, have prescribed limited quantity of narcotic  CAD Presently chest pain-free  Pain control Patient still requiring IV morphine for analgesia Will try to wean to PO narcotic, keeping IV morphine only for severe pain  Discharge Instructions   Allergies as of 07/02/2019      Reactions   Amoxicillin Sodium Rash   Full body rash   Penicillins Rash   amoxicillins       Medication List    TAKE these medications   chlorhexidine 0.12 % solution Commonly known as: PERIDEX Use as directed 15 mLs in the mouth or throat 3 (three) times daily.   docusate sodium 100 MG capsule Commonly known as: Colace Take 1 capsule (100 mg total) by mouth 2 (two) times daily.   oxyCODONE 5 MG immediate release tablet Commonly known as: Oxy IR/ROXICODONE Take 1 tablet (5 mg total) by mouth every 4 (four) hours as needed for severe pain.   pantoprazole 40 MG tablet Commonly known as: PROTONIX Take 1 tablet (40 mg total) by mouth daily. Start taking on: Jul 03, 2019      Follow-up Information    Follow up with your PCP in 2-3 weeks Follow up.        Thornton Park, MD Follow up.   Specialty: Gastroenterology Why: Follow up as scheduled Contact information: [place] [address] [phone]          Allergies  Allergen Reactions  . Amoxicillin Sodium Rash    Full body  rash  . Penicillins Rash    amoxicillins     Consultations:  GI  Procedures/Studies: DG Chest 2 View  Result Date: 06/28/2019 CLINICAL DATA:  Shortness of breath EXAM: CHEST - 2 VIEW COMPARISON:  None.  FINDINGS: Post sternotomy changes. No acute consolidation or effusion. Normal heart size. Aortic atherosclerosis. No pneumothorax. IMPRESSION: No active cardiopulmonary disease. Electronically Signed   By: Donavan Foil M.D.   On: 06/28/2019 17:23   CT Chest W Contrast  Result Date: 06/28/2019 CLINICAL DATA:  Right upper quadrant pain EXAM: CT CHEST, ABDOMEN, AND PELVIS WITH CONTRAST TECHNIQUE: Multidetector CT imaging of the chest, abdomen and pelvis was performed following the standard protocol during bolus administration of intravenous contrast. CONTRAST:  165mL OMNIPAQUE IOHEXOL 300 MG/ML  SOLN COMPARISON:  None. FINDINGS: CT CHEST FINDINGS Cardiovascular: Prior CABG. Heart is normal size. Aorta is normal caliber. Aortic atherosclerosis. Mediastinum/Nodes: Numerous small and borderline sized mediastinal lymph nodes. Pretracheal lymph node has a short axis diameter of 9 mm. Other similarly sized or smaller mediastinal lymph nodes. No hilar or axillary adenopathy. Lungs/Pleura: Biapical scarring. 5 mm nodule in the left lower lobe. Linear atelectasis or scarring at the left base. No effusions. Musculoskeletal: Chest wall soft tissues are unremarkable. No acute bony abnormality. CT ABDOMEN PELVIS FINDINGS Hepatobiliary: Innumerable low-density lesions throughout the liver most compatible with metastases. Index right hepatic lesion posteriorly measures 2.5 cm. The other innumerable lesions are similar or smaller in size. Gallbladder grossly unremarkable. Pancreas: No focal abnormality or ductal dilatation. Spleen: No focal abnormality.  Normal size. Adrenals/Urinary Tract: No adrenal abnormality. No focal renal abnormality. No stones or hydronephrosis. Urinary bladder is unremarkable. Stomach/Bowel: Stomach, large and small bowel grossly unremarkable. Vascular/Lymphatic: Diffuse aortic atherosclerosis. No evidence of aneurysm or adenopathy. Reproductive: Radiation seeds in the region of the prostate. Other: Trace  free fluid in the cul-de-sac.  No free air. Musculoskeletal: No acute bony abnormality. IMPRESSION: Innumerable low-density lesions throughout the liver compatible with metastases. Nonspecific borderline mediastinal lymph nodes, 9 mm or less in size. 5 mm left lower lobe pulmonary nodule. Biapical scarring.  Left base scarring or atelectasis. Aortic atherosclerosis. Electronically Signed   By: Rolm Baptise M.D.   On: 06/28/2019 21:32   CT ABDOMEN PELVIS W CONTRAST  Result Date: 06/28/2019 CLINICAL DATA:  Right upper quadrant pain EXAM: CT CHEST, ABDOMEN, AND PELVIS WITH CONTRAST TECHNIQUE: Multidetector CT imaging of the chest, abdomen and pelvis was performed following the standard protocol during bolus administration of intravenous contrast. CONTRAST:  178mL OMNIPAQUE IOHEXOL 300 MG/ML  SOLN COMPARISON:  None. FINDINGS: CT CHEST FINDINGS Cardiovascular: Prior CABG. Heart is normal size. Aorta is normal caliber. Aortic atherosclerosis. Mediastinum/Nodes: Numerous small and borderline sized mediastinal lymph nodes. Pretracheal lymph node has a short axis diameter of 9 mm. Other similarly sized or smaller mediastinal lymph nodes. No hilar or axillary adenopathy. Lungs/Pleura: Biapical scarring. 5 mm nodule in the left lower lobe. Linear atelectasis or scarring at the left base. No effusions. Musculoskeletal: Chest wall soft tissues are unremarkable. No acute bony abnormality. CT ABDOMEN PELVIS FINDINGS Hepatobiliary: Innumerable low-density lesions throughout the liver most compatible with metastases. Index right hepatic lesion posteriorly measures 2.5 cm. The other innumerable lesions are similar or smaller in size. Gallbladder grossly unremarkable. Pancreas: No focal abnormality or ductal dilatation. Spleen: No focal abnormality.  Normal size. Adrenals/Urinary Tract: No adrenal abnormality. No focal renal abnormality. No stones or hydronephrosis. Urinary bladder is unremarkable. Stomach/Bowel: Stomach, large and  small bowel grossly unremarkable. Vascular/Lymphatic: Diffuse  aortic atherosclerosis. No evidence of aneurysm or adenopathy. Reproductive: Radiation seeds in the region of the prostate. Other: Trace free fluid in the cul-de-sac.  No free air. Musculoskeletal: No acute bony abnormality. IMPRESSION: Innumerable low-density lesions throughout the liver compatible with metastases. Nonspecific borderline mediastinal lymph nodes, 9 mm or less in size. 5 mm left lower lobe pulmonary nodule. Biapical scarring.  Left base scarring or atelectasis. Aortic atherosclerosis. Electronically Signed   By: Rolm Baptise M.D.   On: 06/28/2019 21:32   US BIOPSY (LIVER)  Result Date: 07/01/2019 INDICATION: 85 year old male with multifocal hepatic lesions concerning for metastatic disease. He does have a history of prostate cancer. He presents for ultrasound-guided biopsy of a liver lesion. EXAM: ULTRASOUND BIOPSY CORE LIVER MEDICATIONS: None. ANESTHESIA/SEDATION: Moderate (conscious) sedation was employed during this procedure. A total of Versed 1 mg and Fentanyl 50 mcg was administered intravenously. Moderate Sedation Time: 11 minutes. The patient's level of consciousness and vital signs were monitored continuously by radiology nursing throughout the procedure under my direct supervision. FLUOROSCOPY TIME:  None. COMPLICATIONS: None immediate. PROCEDURE: Informed written consent was obtained from the patient after a thorough discussion of the procedural risks, benefits and alternatives. All questions were addressed. A timeout was performed prior to the initiation of the procedure. Ultrasound was used to interrogate the liver. There are multiple subtly hypoechoic solid lesions scattered throughout the liver. A suitable lesion was identified and a skin entry site selected and marked. The skin was sterilely prepped and draped in the standard fashion using chlorhexidine skin prep. Local anesthesia was attained by infiltration with 1%  lidocaine. A small dermatotomy was made. Under real-time ultrasound guidance, a 17 gauge introducer needle was advanced through the liver and positioned at the margin of the lesion. Multiple 18 gauge core biopsies were coaxially obtained using the BioPince automated biopsy device. Biopsy specimens were placed in formalin and delivered to pathology for further analysis. As the introducer needle was removed, the biopsy tract was embolized with a Gel-Foam slurry. IMPRESSION: Technically successful ultrasound-guided core biopsy of liver lesion. Electronically Signed   By: Jacqulynn Cadet M.D.   On: 07/01/2019 14:37   MR ABDOMEN MRCP W WO CONTAST  Result Date: 06/29/2019 CLINICAL DATA:  85 year old male with history of right upper quadrant abdominal pain for the past 3 weeks. Dark colored urine and light colored stool. Acute hepatitis. Multiple hypovascular hepatic lesions noted on recent CT the abdomen and pelvis. EXAM: MRI ABDOMEN WITHOUT AND WITH CONTRAST (INCLUDING MRCP) TECHNIQUE: Multiplanar multisequence MR imaging of the abdomen was performed both before and after the administration of intravenous contrast. Heavily T2-weighted images of the biliary and pancreatic ducts were obtained, and three-dimensional MRCP images were rendered by post processing. CONTRAST:  26mL GADAVIST GADOBUTROL 1 MMOL/ML IV SOLN COMPARISON:  No prior abdominal MRI. CT the abdomen and pelvis 06/28/2019. FINDINGS: Lower chest: Scarring in the posterior aspect of the left lower lobe, better demonstrated on recent chest CT. Hepatobiliary: Innumerable T1 hypointense, T2 hyperintense hypovascular lesions scattered throughout the hepatic parenchyma, indicative of widespread metastatic disease. The largest of these lesions is noted in the central aspect of the liver between segments 4A and 8 (axial image 13 of series 8) measuring 3.6 x 2.3 cm. No intra or extrahepatic biliary ductal dilatation. Gallbladder is nearly decompressed. Gallbladder  wall appears mildly thickened and edematous. No filling defects in the gallbladder to suggest cholelithiasis. Pancreas: There is a poorly defined mass in the tail of the pancreas which is slightly T1 hypointense,  T2 hypointense and appears hypovascular on post gadolinium imaging measuring 3.5 x 2.8 x 2.6 cm (axial image 54 of series 33 and coronal image 35 of series 39). This is intimately associated with the splenic hilum, without definite evidence of direct splenic invasion. No pancreatic ductal dilatation. No pancreatic or peripancreatic fluid collections or inflammatory changes. Spleen: Mass in tail of pancreas intimately associated with the splenic hilum without evidence of direct invasion. Adrenals/Urinary Tract: Subcentimeter T1 hypointense, T2 hyperintense, nonenhancing lesions in both kidneys, compatible with tiny simple cysts. No hydroureteronephrosis in the visualized portions of the abdomen. Bilateral adrenal glands are normal in appearance. Stomach/Bowel: Visualized portions are unremarkable. Vascular/Lymphatic: Aortic atherosclerosis, without evidence of aneurysm in the abdominal vasculature. Lesion in the tail of the pancreas is intimately associated with the splenic artery and vein. Prominent but nonenlarged portacaval lymph node measuring 9 mm in short axis which demonstrates diffusion restriction. Other: No significant volume of ascites noted in the visualized portions of the peritoneal cavity. Musculoskeletal: No suspicious osseous lesions noted in the visualized portions of the skeleton. IMPRESSION: 1. 3.5 x 2.8 x 2.6 cm hypovascular mass in the tail of the pancreas intimately associated with the splenic hilum without definite evidence of direct splenic invasion. Correlation with endoscopic ultrasound and biopsy is recommended to establish a tissue diagnosis. 2. Innumerable lesions noted throughout the liver compatible with widespread metastatic disease. 3. Nonenlarged but suspicious 9 mm short  axis portacaval lymph node which demonstrates diffusion restriction, likely to represent nodal metastasis. 4. Aortic atherosclerosis. Electronically Signed   By: Vinnie Langton M.D.   On: 06/29/2019 11:16     Subjective: Eager to go home  Discharge Exam: Vitals:   07/02/19 0839 07/02/19 1246  BP: (!) 149/69 (!) 149/76  Pulse: 87 97  Resp: 16 18  Temp: (!) 97.4 F (36.3 C) (!) 97.4 F (36.3 C)  SpO2: 97% 95%   Vitals:   07/01/19 2334 07/02/19 0443 07/02/19 0839 07/02/19 1246  BP: (!) 143/73 131/72 (!) 149/69 (!) 149/76  Pulse: 83 88 87 97  Resp: 18 16 16 18   Temp: 98.4 F (36.9 C) 97.7 F (36.5 C) (!) 97.4 F (36.3 C) (!) 97.4 F (36.3 C)  TempSrc:  Oral Oral Oral  SpO2: 93% 95% 97% 95%  Weight:      Height:        General: Pt is alert, awake, not in acute distress Cardiovascular: RRR, S1/S2 +, no rubs, no gallops Respiratory: CTA bilaterally, no wheezing, no rhonchi Abdominal: Soft, NT, ND, bowel sounds + Extremities: no edema, no cyanosis   The results of significant diagnostics from this hospitalization (including imaging, microbiology, ancillary and laboratory) are listed below for reference.     Microbiology: Recent Results (from the past 240 hour(s))  Respiratory Panel by RT PCR (Flu A&B, Covid) - Nasopharyngeal Swab     Status: None   Collection Time: 06/28/19 10:01 PM   Specimen: Nasopharyngeal Swab  Result Value Ref Range Status   SARS Coronavirus 2 by RT PCR NEGATIVE NEGATIVE Final    Comment: (NOTE) SARS-CoV-2 target nucleic acids are NOT DETECTED. The SARS-CoV-2 RNA is generally detectable in upper respiratoy specimens during the acute phase of infection. The lowest concentration of SARS-CoV-2 viral copies this assay can detect is 131 copies/mL. A negative result does not preclude SARS-Cov-2 infection and should not be used as the sole basis for treatment or other patient management decisions. A negative result may occur with  improper specimen  collection/handling, submission of  specimen other than nasopharyngeal swab, presence of viral mutation(s) within the areas targeted by this assay, and inadequate number of viral copies (<131 copies/mL). A negative result must be combined with clinical observations, patient history, and epidemiological information. The expected result is Negative. Fact Sheet for Patients:  PinkCheek.be Fact Sheet for Healthcare Providers:  GravelBags.it This test is not yet ap proved or cleared by the [place] FDA and  has been authorized for detection and/or diagnosis of SARS-CoV-2 by FDA under an Emergency Use Authorization (EUA). This EUA will remain  in effect (meaning this test can be used) for the duration of the COVID-19 declaration under Section 564(b)(1) of the Act, 21 U.S.C. section 360bbb-3(b)(1), unless the authorization is terminated or revoked sooner.    Influenza A by PCR NEGATIVE NEGATIVE Final   Influenza B by PCR NEGATIVE NEGATIVE Final    Comment: (NOTE) The Xpert Xpress SARS-CoV-2/FLU/RSV assay is intended as an aid in  the diagnosis of influenza from Nasopharyngeal swab specimens and  should not be used as a sole basis for treatment. Nasal washings and  aspirates are unacceptable for Xpert Xpress SARS-CoV-2/FLU/RSV  testing. Fact Sheet for Patients: PinkCheek.be Fact Sheet for Healthcare Providers: GravelBags.it This test is not yet approved or cleared by the [place] FDA and  has been authorized for detection and/or diagnosis of SARS-CoV-2 by  FDA under an Emergency Use Authorization (EUA). This EUA will remain  in effect (meaning this test can be used) for the duration of the  Covid-19 declaration under Section 564(b)(1) of the Act, 21  U.S.C. section 360bbb-3(b)(1), unless the authorization is  terminated or revoked. Performed at [facility], [place] [address] [place], [place] [address]      Labs: BNP (last 3 results) No results for input(s): BNP in the last 8760 hours. Basic Metabolic Panel: Recent Labs  Lab 06/28/19 1633 06/29/19 0610 06/30/19 0447 07/01/19 0444 07/02/19 0444  NA 133* 134* 136 134* 133*  K 4.4 4.3 3.9 4.0 3.9  CL 96* 99 100 101 100  CO2 27 25 26 26 25   GLUCOSE 145* 91 116* 136* 117*  BUN 12 12 14 15 10   CREATININE 1.20 1.08 1.24 1.21 0.99  CALCIUM 9.3 9.2 9.3 8.4* 8.3*   Liver Function Tests: Recent Labs  Lab 06/28/19 1633 06/29/19 0610 06/30/19 0447 07/01/19 0444 07/02/19 0444  AST 144* 135* 155* 154* 155*  ALT 96* 88* 98* 89* 91*  ALKPHOS 406* 380* 407* 365* 341*  BILITOT 5.7* 7.1* 6.5* 5.6* 7.6*  PROT 7.4 6.5 6.5 6.3* 6.0*  ALBUMIN 3.1* 2.8* 2.8* 2.6* 2.4*   Recent Labs  Lab 06/28/19 1633  LIPASE 38   No results for input(s): AMMONIA in the last 168 hours. CBC: Recent Labs  Lab 06/28/19 1633 06/29/19 0610 06/30/19 0447 07/01/19 0444 07/02/19 0444  WBC 8.5 8.3 9.4 8.5 7.4  NEUTROABS  --  5.3 6.3 5.6 4.5  HGB 15.0 14.5 15.0 13.8 13.3  HCT 44.6 42.7 44.1 40.3 39.0  MCV 94.5 94.1 93.2 92.9 93.3  PLT 163 138* 163 162 148*   Cardiac Enzymes: No results for input(s): CKTOTAL, CKMB, CKMBINDEX, TROPONINI in the last 168 hours. BNP: Invalid input(s): POCBNP CBG: No results for input(s): GLUCAP in the last 168 hours. D-Dimer No results for input(s): DDIMER in the last 72 hours. Hgb A1c No results for input(s): HGBA1C in the last 72 hours. Lipid Profile No results for input(s): CHOL, HDL, LDLCALC, TRIG, CHOLHDL, LDLDIRECT in the last  72 hours. Thyroid function studies No results for input(s): TSH, T4TOTAL, T3FREE, THYROIDAB in the last 72 hours.  Invalid input(s): FREET3 Anemia work up No results for input(s): VITAMINB12, FOLATE, FERRITIN, TIBC, IRON, RETICCTPCT in the last 72 hours. Urinalysis    Component Value Date/Time   COLORURINE AMBER (A) 06/28/2019 1922    APPEARANCEUR CLEAR 06/28/2019 1922   LABSPEC 1.023 06/28/2019 1922   PHURINE 5.0 06/28/2019 [place] NEGATIVE 06/28/2019 [place] NEGATIVE 06/28/2019 [place] (A) 06/28/2019 [place] NEGATIVE 06/28/2019 1922   PROTEINUR 12 (A) 06/28/2019 1922   NITRITE NEGATIVE 06/28/2019 1922   LEUKOCYTESUR NEGATIVE 06/28/2019 1922   Sepsis Labs Invalid input(s): PROCALCITONIN,  WBC,  LACTICIDVEN Microbiology Recent Results (from the past 240 hour(s))  Respiratory Panel by RT PCR (Flu A&B, Covid) - Nasopharyngeal Swab     Status: None   Collection Time: 06/28/19 10:01 PM   Specimen: Nasopharyngeal Swab  Result Value Ref Range Status   SARS Coronavirus 2 by RT PCR NEGATIVE NEGATIVE Final    Comment: (NOTE) SARS-CoV-2 target nucleic acids are NOT DETECTED. The SARS-CoV-2 RNA is generally detectable in upper respiratoy specimens during the acute phase of infection. The lowest concentration of SARS-CoV-2 viral copies this assay can detect is 131 copies/mL. A negative result does not preclude SARS-Cov-2 infection and should not be used as the sole basis for treatment or other patient management decisions. A negative result may occur with  improper specimen collection/handling, submission of specimen other than nasopharyngeal swab, presence of viral mutation(s) within the areas targeted by this assay, and inadequate number of viral copies (<131 copies/mL). A negative result must be combined with clinical observations, patient history, and epidemiological information. The expected result is Negative. Fact Sheet for Patients:  PinkCheek.be Fact Sheet for Healthcare Providers:  GravelBags.it This test is not yet ap proved or cleared by the [place] FDA and  has been authorized for detection and/or diagnosis of SARS-CoV-2 by FDA under an Emergency Use Authorization (EUA). This EUA will remain  in effect  (meaning this test can be used) for the duration of the COVID-19 declaration under Section 564(b)(1) of the Act, 21 U.S.C. section 360bbb-3(b)(1), unless the authorization is terminated or revoked sooner.    Influenza A by PCR NEGATIVE NEGATIVE Final   Influenza B by PCR NEGATIVE NEGATIVE Final    Comment: (NOTE) The Xpert Xpress SARS-CoV-2/FLU/RSV assay is intended as an aid in  the diagnosis of influenza from Nasopharyngeal swab specimens and  should not be used as a sole basis for treatment. Nasal washings and  aspirates are unacceptable for Xpert Xpress SARS-CoV-2/FLU/RSV  testing. Fact Sheet for Patients: PinkCheek.be Fact Sheet for Healthcare Providers: GravelBags.it This test is not yet approved or cleared by the [place] FDA and  has been authorized for detection and/or diagnosis of SARS-CoV-2 by  FDA under an Emergency Use Authorization (EUA). This EUA will remain  in effect (meaning this test can be used) for the duration of the  Covid-19 declaration under Section 564(b)(1) of the Act, 21  U.S.C. section 360bbb-3(b)(1), unless the authorization is  terminated or revoked. Performed at [facility], [address] [place], [place] [address]    Time spent: 30 min  SIGNED:   Marylu Lund, MD  [facility] 07/02/2019, 1:12 PM  If 7PM-7AM, please contact night-coverage

## 2019-07-02 NOTE — Discharge Instructions (Signed)
Follow up with [facility] regarding your pathology results

## 2019-07-03 ENCOUNTER — Telehealth: Payer: Self-pay | Admitting: Nurse Practitioner

## 2019-07-03 NOTE — Telephone Encounter (Signed)
  He is status post liver biopsy on 5/7 for liver lesions with metastasis in setting of jaundice.  His discharge from the hospital yesterday.  Also has a history of prostate cancer treated with radiation.  He did not pass a bowel movement yesterday.  He used a suppository last night then used an enema which resulted in passing a moderate amount of hard stool but he did not feel completely emptied.  This morning he drank prune juice and inserted another Dulcolax suppository resulted in passing further hard stool with a moderate amount of bright red blood on the tissue, stool and in the toilet water.  No further bleeding since then.  He denies being any stress.  I advised the patient not to use any suppositories or enemas.  I advised him to take MiraLAX take 1 capful mixed in 8 ounces water at bedtime and to use Desitin apply a small amount inside the anal area into the external anal area 3 times daily for the next few days.  I will forward this message to Dr. Tarri Glenn and I will request for  her nurse to contact the patient on Monday for further follow-up.  Patient was advised to call our service back if his symptoms worsen.  If he develops severe rectal bleeding to go to emergency room.  He verbalized understanding his instructions.

## 2019-07-05 ENCOUNTER — Emergency Department (HOSPITAL_COMMUNITY)
Admission: EM | Admit: 2019-07-05 | Discharge: 2019-07-06 | Disposition: A | Discharge status: Home / Self Care | Payer: No Typology Code available for payment source | Attending: Emergency Medicine | Admitting: Emergency Medicine

## 2019-07-05 ENCOUNTER — Telehealth: Payer: Self-pay | Admitting: Nurse Practitioner

## 2019-07-05 ENCOUNTER — Other Ambulatory Visit: Payer: Self-pay

## 2019-07-05 ENCOUNTER — Encounter (HOSPITAL_COMMUNITY): Payer: Self-pay | Admitting: Emergency Medicine

## 2019-07-05 DIAGNOSIS — Z79899 Other long term (current) drug therapy: Secondary | ICD-10-CM | POA: Insufficient documentation

## 2019-07-05 DIAGNOSIS — R160 Hepatomegaly, not elsewhere classified: Secondary | ICD-10-CM | POA: Insufficient documentation

## 2019-07-05 DIAGNOSIS — K5903 Drug induced constipation: Secondary | ICD-10-CM | POA: Insufficient documentation

## 2019-07-05 DIAGNOSIS — Z87891 Personal history of nicotine dependence: Secondary | ICD-10-CM | POA: Diagnosis not present

## 2019-07-05 DIAGNOSIS — R1011 Right upper quadrant pain: Secondary | ICD-10-CM | POA: Diagnosis present

## 2019-07-05 DIAGNOSIS — I2510 Atherosclerotic heart disease of native coronary artery without angina pectoris: Secondary | ICD-10-CM | POA: Insufficient documentation

## 2019-07-05 DIAGNOSIS — T402X5A Adverse effect of other opioids, initial encounter: Secondary | ICD-10-CM | POA: Insufficient documentation

## 2019-07-05 LAB — COMPREHENSIVE METABOLIC PANEL
ALT: 107 U/L — ABNORMAL HIGH (ref 0–44)
AST: 189 U/L — ABNORMAL HIGH (ref 15–41)
Albumin: 2.8 g/dL — ABNORMAL LOW (ref 3.5–5.0)
Alkaline Phosphatase: 474 U/L — ABNORMAL HIGH (ref 38–126)
Anion gap: 15 (ref 5–15)
BUN: 9 mg/dL (ref 8–23)
CO2: 25 mmol/L (ref 22–32)
Calcium: 9 mg/dL (ref 8.9–10.3)
Chloride: 90 mmol/L — ABNORMAL LOW (ref 98–111)
Creatinine, Ser: 1.05 mg/dL (ref 0.61–1.24)
GFR calc Af Amer: >60 mL/min (ref >60)
GFR calc non Af Amer: >60 mL/min (ref >60)
Glucose, Bld: 139 mg/dL — ABNORMAL HIGH (ref 70–99)
Potassium: 4.3 mmol/L (ref 3.5–5.1)
Sodium: 130 mmol/L — ABNORMAL LOW (ref 135–145)
Total Bilirubin: 16 mg/dL — ABNORMAL HIGH (ref 0.3–1.2)
Total Protein: 7.3 g/dL (ref 6.5–8.1)

## 2019-07-05 LAB — CBC
HCT: 43.3 % (ref 39.0–52.0)
Hemoglobin: 15 g/dL (ref 13.0–17.0)
MCH: 31.9 pg (ref 26.0–34.0)
MCHC: 34.6 g/dL (ref 30.0–36.0)
MCV: 92.1 fL (ref 80.0–100.0)
Platelets: 189 10*3/uL (ref 150–400)
RBC: 4.7 MIL/uL (ref 4.22–5.81)
RDW: 17 % — ABNORMAL HIGH (ref 11.5–15.5)
WBC: 10.4 10*3/uL (ref 4.0–10.5)
nRBC: 0 % (ref 0.0–0.2)

## 2019-07-05 LAB — URINALYSIS, ROUTINE W REFLEX MICROSCOPIC
Glucose, UA: 50 mg/dL — AB
Ketones, ur: NEGATIVE mg/dL
Leukocytes,Ua: NEGATIVE
Nitrite: NEGATIVE
Protein, ur: 30 mg/dL — AB
Specific Gravity, Urine: 1.023 (ref 1.005–1.030)
pH: 5 (ref 5.0–8.0)

## 2019-07-05 LAB — LIPASE, BLOOD: Lipase: 24 U/L (ref 11–51)

## 2019-07-05 MED ORDER — SODIUM CHLORIDE 0.9% FLUSH: 3.0000 mL | Freq: Once | INTRAVENOUS | Status: DC

## 2019-07-05 NOTE — Telephone Encounter (Signed)
Patient called is seeking advise

## 2019-07-05 NOTE — ED Triage Notes (Signed)
Pt was seen here on 5/3 and was being worked up for masses found on liver and pancreas- pt has seen had CT, [place] , MRI and liver biopsy.    Pt here today due to constipation after over the counter enema did have a BM on Saturday but none since. Pt states he was given medication that has helped with the pain but feels tightness in his abdomen and bloating.

## 2019-07-06 ENCOUNTER — Telehealth: Payer: Self-pay

## 2019-07-06 ENCOUNTER — Other Ambulatory Visit: Payer: Self-pay

## 2019-07-06 ENCOUNTER — Emergency Department (HOSPITAL_COMMUNITY): Payer: No Typology Code available for payment source

## 2019-07-06 DIAGNOSIS — C689 Malignant neoplasm of urinary organ, unspecified: Secondary | ICD-10-CM

## 2019-07-06 LAB — SURGICAL PATHOLOGY

## 2019-07-06 MED ORDER — POLYETHYLENE GLYCOL 3350 17 GM/SCOOP PO POWD: 1.0000 | Freq: Every day | ORAL | 0 refills | Status: DC

## 2019-07-06 MED ORDER — BISACODYL 5 MG PO TBEC
5.0000 mg | DELAYED_RELEASE_TABLET | Freq: Every day | ORAL | 0 refills | Status: DC
Start: 2019-07-06 — End: 2019-07-12

## 2019-07-06 NOTE — Discharge Instructions (Addendum)
You came to the ER for constipation  We suspect constipation is due to side effects of opioid medication for pain.  This is a common side effect.  Lab work today showed elevated bilirubin.  Ultrasound did not show any dilation or obstruction in your ducts.  The elevation in bilirubin is likely from the masses found in your liver affecting the function of your liver.  We consulted the GI team who did not recommend any immediate interventions.  The GI clinic will follow up on your pathology results and contact you once they are back to help you schedule follow-up appointments as needed.  If you do not hear back from the GI clinic please call them in the next week to follow-up on your results.  To help with constipation GI recommends a dulcolax (bisacodyl)and miralax regimen.  When you get home today take 4 Dulcolax pills at once.  Wait 2 hours and start MiraLAX solution and finish it over the course of 2 hours.  Miralax soluation is 6 capfuls of miralax powder in 32 oz of liquid (water, gatorade, etc).  This solution should be finished over the course of 2 hours.   In 24 hours, take 1 dulcolax every night and 2 capfuls of miralax powder dissolved in 16 oz of water every day.  Your stool should be soft, pasty.    Return to the ER if you have sudden or severe abdominal pain that is constant, inability to pass stool, large amounts of blood through the rectum, nausea, vomiting, fevers

## 2019-07-06 NOTE — Telephone Encounter (Signed)
-----   Message from Thornton Park, MD sent at 07/06/2019  1:54 PM EDT ----- Please arrange referral to oncology: urothelial cancer.  Thank you.  ----- Message ----- From: Vena Rua, PA-C Sent: 07/06/2019   1:37 PM EDT To: Thornton Park, MD  This pt came to ED with constipation.  Bili up to 16 so gessner ordered ultrasound.  Looks like tumor burden is causing rise in t bili. Finally got path report, verbal, from Dr Tresa Moore.  This is urothelial in origin per stains.   Stains and markers for pancreas, prostate are negative. In any event pt will need referral to oncology, and since his PMD is at the [place], you are it in terms of local MDs who can refer to cancer center.   Thanks, S

## 2019-07-06 NOTE — ED Notes (Signed)
Patient verbalizes understanding of discharge instructions. Opportunity for questioning and answers were provided. Armband removed by staff, pt discharged from ED. Wheeled out to lobby  

## 2019-07-06 NOTE — Telephone Encounter (Signed)
Patient to ED. See notes.

## 2019-07-06 NOTE — Telephone Encounter (Signed)
Referral entered in epic for cancer center referral.

## 2019-07-06 NOTE — ED Provider Notes (Addendum)
[place] EMERGENCY DEPARTMENT Provider Note   CSN: [id number] Arrival date & time: 07/05/19  1815     History Chief Complaint  Patient presents with  . Abdominal Pain    Thomas Riddle is a 85 y.o. male with past medical history of CAD/CABG, prostate cancer with radioactive seed implant, recently diagnosed metastatic disease in liver and pancreas, appendectomy, hernia repair presents to the ER for evaluation of constipation for the last 4 to 5 days. He presented to ER last week for RUQ abdominal pain, dark urine and jaundice.  Subsequently admitted and discharged 4 days ago with diagnosis of metastatic cancer with unknown primary.  He was given hydrocodone in hospital for abdominal pain.  Reports constipation began while in hospital.  He inserted suppositories and enema and had a very large, hard, painful bowel movement 2 days ago but none since.  Had about an ounce of hematochezia after, none since. He called [facility] and was told to take miralax. He took 2 capfuls on Sunday but none since.  Continues to have mild upper abdominal pain that is well controlled with hydrocodone.  Denies worsening abdominal pain.  No associated fever, nausea, vomiting, abdominal distention, melena. No urinary symptoms.  Denies significant rectal pain, fullness or impaction symptoms.  Awaiting biopsy results from last week. Has been eating 75% less than usual for the last week.      HPI     Past Medical History:  Diagnosis Date  . CAD (coronary artery disease) 06/28/2019  . Hyperbilirubinemia 06/2019  . Prostate cancer [facility])     Patient Active Problem List   Diagnosis Date Noted  . Pancreatic mass   . Jaundice   . Hyperbilirubinemia 06/28/2019  . Hyperglycemia 06/28/2019  . CAD (coronary artery disease) 06/28/2019  . Liver masses 06/28/2019    Past Surgical History:  Procedure Laterality Date  . APPENDECTOMY    . CORONARY ARTERY BYPASS GRAFT    . HERNIA REPAIR    . RADIOACTIVE  SEED IMPLANT N/A    Prostate cancer       Family History  Problem Relation Age of Onset  . Alcohol abuse Father   . Colon cancer Sister   . Lung cancer Brother     Social History   Tobacco Use  . Smoking status: Former Research scientist (life sciences)  . Smokeless tobacco: Never Used  Substance Use Topics  . Alcohol use: Yes  . Drug use: No    Home Medications Prior to Admission medications   Medication Sig Start Date End Date Taking? Authorizing Provider  chlorhexidine (PERIDEX) 0.12 % solution Use as directed 15 mLs in the mouth or throat 3 (three) times daily.   Yes [provider]  docusate sodium (COLACE) 100 MG capsule Take 1 capsule (100 mg total) by mouth 2 (two) times daily. 07/02/19 08/01/19 Yes Donne Hazel, MD  oxyCODONE (OXY IR/ROXICODONE) 5 MG immediate release tablet Take 1 tablet (5 mg total) by mouth every 4 (four) hours as needed for severe pain. 07/02/19  Yes Donne Hazel, MD  pantoprazole (PROTONIX) 40 MG tablet Take 1 tablet (40 mg total) by mouth daily. 07/03/19 08/02/19 Yes Donne Hazel, MD    Allergies    Amoxicillin sodium and Penicillins  Review of Systems   Review of Systems  Constitutional: Positive for appetite change.  Gastrointestinal: Positive for abdominal pain, blood in stool and constipation.  All other systems reviewed and are negative.   Physical Exam Updated Vital Signs BP  128/61   Pulse (!) 102   Temp 98.6 F (37 C) (Oral)   Resp 16   SpO2 96%   Physical Exam Vitals and nursing note reviewed.  Constitutional:      Appearance: He is well-developed.     Comments: Non toxic.  HENT:     Head: Normocephalic and atraumatic.     Nose: Nose normal.  Eyes:     General: Scleral icterus present.     Conjunctiva/sclera: Conjunctivae normal.  Cardiovascular:     Rate and Rhythm: Normal rate and regular rhythm.     Heart sounds: Normal heart sounds.  Pulmonary:     Effort: Pulmonary effort is normal.     Breath sounds: Normal breath sounds.    Abdominal:     General: A surgical scar is present. Bowel sounds are normal.     Palpations: Abdomen is soft.     Tenderness: There is abdominal tenderness in the right upper quadrant and epigastric area.     Comments: Mild RUQ epigastric tenderness with deep palpation.  Positive Murphy's.  No periumbilical or lower abdominal tenderness. Active BS throughout.  No significant distention or ascites.  RLQ open appendectomy scar.  No G/R/R. No suprapubic or CVA tenderness. Negative McBurney's  Genitourinary:    Comments: Perianal skin normal without fissures, hemorrhoids.  Good rectal tone. Minimal brown stool in rectal vault, no impaction.  Musculoskeletal:        General: Normal range of motion.     Cervical back: Normal range of motion.  Skin:    General: Skin is warm and dry.     Capillary Refill: Capillary refill takes less than 2 seconds.  Neurological:     Mental Status: He is alert.  Psychiatric:        Behavior: Behavior normal.     ED Results / Procedures / Treatments   Labs (all labs ordered are listed, but only abnormal results are displayed) Labs Reviewed  COMPREHENSIVE METABOLIC PANEL - Abnormal; Notable for the following components:      Result Value   Sodium 130 (*)    Chloride 90 (*)    Glucose, Bld 139 (*)    Albumin 2.8 (*)    AST 189 (*)    ALT 107 (*)    Alkaline Phosphatase 474 (*)    Total Bilirubin 16.0 (*)    All other components within normal limits  CBC - Abnormal; Notable for the following components:   RDW 17.0 (*)    All other components within normal limits  URINALYSIS, ROUTINE W REFLEX MICROSCOPIC - Abnormal; Notable for the following components:   Color, Urine AMBER (*)    Glucose, UA 50 (*)    Hgb urine dipstick SMALL (*)    Bilirubin Urine MODERATE (*)    Protein, ur 30 (*)    Bacteria, UA RARE (*)    All other components within normal limits  LIPASE, BLOOD    EKG None  Radiology US Abdomen Limited RUQ  Result Date:  07/06/2019 CLINICAL DATA:  Right upper quadrant pain. Hepatic metastatic disease. EXAM: ULTRASOUND ABDOMEN LIMITED RIGHT UPPER QUADRANT COMPARISON:  CT 06/28/2019 FINDINGS: Gallbladder: With diffuse gallbladder wall thickening measuring up to 4 mm. 4 mm echogenic, nonshadowing focus along the non dependent portion of the gallbladder lumen which may reflect a polyp or small impacted stone. No sonographic Murphy sign noted by sonographer. Common bile duct: Diameter: 4 mm Liver: Innumerable rounded heterogeneously hypoechoic masses throughout the right and left hepatic  lobes. Within normal limits in parenchymal echogenicity. Portal vein is patent on color Doppler imaging with normal direction of blood flow towards the liver. Other: None. IMPRESSION: 1. Mild circumferential gallbladder wall thickening, nonspecific in can be seen in the setting of diffuse hepatocellular disease, low protein states, as well as cholecystitis. No pericholecystic fluid. Negative sonographic Murphy sign. If there is clinical suspicion for cholecystitis, a nuclear medicine hepatobiliary scan could be performed. 2. Small probable gallbladder wall polyp. 3. Diffuse hepatic metastatic disease, as seen on previous studies. Electronically Signed   By: Davina Poke D.O.   On: 07/06/2019 10:20    Procedures Procedures (including critical care time)  Medications Ordered in ED Medications  sodium chloride flush (NS) 0.9 % injection 3 mL (has no administration in time range)    ED Course  I have reviewed the triage vital signs and the nursing notes.  Pertinent labs & imaging results that were available during my care of the patient were reviewed by me and considered in my medical decision making (see chart for details).  Clinical Course as of Jul 05 1112  Tue Jul 06, 2019  [id number] Temp: 98.6 F (37 C) [CG]  0643 Pulse Rate: 94 [CG]  0643 BP(!): 153/81 [CG]  0643 Bilirubin Urine(!): MODERATE [CG]  0643 Protein(!): 30 [CG]  0643  Bacteria, UA(!): RARE [CG]  0643 WBC, UA: 0-5 [CG]  0643 Leukocytes,Ua: NEGATIVE [CG]  0643 Nitrite: NEGATIVE [CG]  0643 WBC: 10.4 [CG]  0643 Lipase: 24 [CG]  0644 Sodium(!): 130 [CG]  0644 Chloride(!): 90 [CG]  0644 Glucose(!): 139 [CG]  0644 Albumin(!): 2.8 [CG]  0644 AST(!): 189 [CG]  0644 ALT(!): 107 [CG]  0644 Alkaline Phosphatase(!): 474 [CG]  0644 Total Bilirubin(!): 16.0 [CG]  0753 Dr Carlean Purl consulted recommends RUQ [place] to look for dilated BD consuder call back if significantly dilated. May need stent.  Unlikely to have SBO or surgical emergency causing constipation likely related to new opioid use recommends "colonoscopy prep" including 4 dulcolax, wait 1 hr and then take 6 capfuls of miralax over next 2 hours, followed by dulcolax qhs and 2 capfuls daily miralax   [CG]  1050 1. Mild circumferential gallbladder wall thickening, nonspecific in can be seen in the setting of diffuse hepatocellular disease, low protein states, as well as cholecystitis. No pericholecystic fluid. Negative sonographic Murphy sign. If there is clinical suspicion for cholecystitis, a nuclear medicine hepatobiliary scan could be performed. 2. Small probable gallbladder wall polyp. 3. Diffuse hepatic metastatic disease, as seen on previous studies.  US Abdomen Limited RUQ [CG]  1050 Spoke to GI PA who will discuss pt with attending, pending recs    [CG]  1107 Spoke to GI PA who has discussed patient with attending.  GI suspects elevated total bilirubin is from metastatic disease of cancer, unlikely for it to be related to obstruction.  They do not recommend any emergent interventions, HIDA scan, stenting or admission.  GI will follow up on pathology results and contact patient to assist with follow-up and oncology referral.  GI recommends Dulcolax and MiraLAX regimen to facilitate constipation.   [CG]    Clinical Course User Index [CG] Arlean Hopping   MDM Rules/Calculators/A&P                       Patient's EMR personally reviewed to assist with MDM.    Elevated LFTs and total bilirubin 7.6 on 5/7.  Imaging on 5/3 CT A/P and MR  abdomen reviewed shows several lesions in the liver, pancreas suggestive of metastatic disease.  No biliary ductal dilation noted on MRI on 5/3.  US biopsy pathology results pending.  Highest on ddx is opioid induced constipation possibly compounded by decreased appetite and oral intake, dehydration.  His abdomen is benign other than very mild RUQ tenderness which he states has not worsened.  No other abdominal tenderness, distention, peritonitis.  He has no fever, nausea or vomiting.  He is passing gas.  Has had an open appendectomy, hernia repair but SBO or other surgical emergency causing constipation is considered unlikely.  He had no masses in his colon to cause an obstruction and recent imaging.  DRE performed today reveals no abnormalities or impaction. Will defer higher level of imaging as suspicion for SBO, ileus is low.    ER work-up initiated in triage, personally reviewed and interpreted.    LFTs elevated but similar to previous.  Total bilirubin 16.0, from 7.6.  He reports darker urineand more jaundice.  Given elevation in this, GI consulted.  0830: Pending RUQ [place] to evaluate bile duct dilation.  Hydrocodone for pain.    1050: GI consulted, spoke to PA who will discuss with attending. Pending recs. Re-evaluated pt, mild RUQ tenderness. Ambulatory to bathroom.   1115: GI not recommending any emergent interventions, as above.  Recommending bowel regimen to help with constipation.  GI to follow-up on pathology results and will contact patient to facilitate follow-up.  Will discharge patient with Dulcolax, MiraLAX regimen, oral hydration.  Discussed at length bowel regimen instructions, appears to have good understanding.  Return precautions discussed.  Shared with EDP.  Final Clinical Impression(s) / ED Diagnoses Final diagnoses:  Bilirubinemia  Liver  masses  Therapeutic opioid induced constipation    Rx / DC Orders ED Discharge Orders    None         Kinnie Feil, PA-C 07/06/19 1148    Sherwood Gambler, MD 07/06/19 1225

## 2019-07-07 ENCOUNTER — Telehealth: Payer: Self-pay | Admitting: Gastroenterology

## 2019-07-07 NOTE — Telephone Encounter (Signed)
There is another thread regarding my trying to reach Mr. Tavani.

## 2019-07-07 NOTE — Telephone Encounter (Signed)
Dr. Tarri Glenn did you call Thomas Riddle? States she is returning a call.

## 2019-07-07 NOTE — Telephone Encounter (Signed)
Pt is aware of results, Dr. Tarri Glenn called and spoke with the pt.

## 2019-07-07 NOTE — Telephone Encounter (Signed)
I attempted to call the patient again. After several rings, the phone when to voicemail. I left a brief message and asked him to return his call to the office. Thanks.

## 2019-07-07 NOTE — Telephone Encounter (Signed)
Inpatient GI team asked me to arrange the referral to oncology so I was under the impression these results were reviewed with the patient prior to that request. I tried to call Thomas Riddle today and went directly to voicemail.

## 2019-07-07 NOTE — Telephone Encounter (Signed)
Results reviewed with patient and Cherlyn Roberts by phone. All questions answered to their satisfaction. Please process referral to oncology. Thank you.

## 2019-07-07 NOTE — Telephone Encounter (Signed)
Called Thomas Riddle about [place] auth needed to be seen over at the cancer center.  He said that he has not been given any results and wants to discuss with someone first.  I did tell him he did have an auth for in-patient services put in the Epic system, but that this was an outpatient service and will need to get his PCP at the [place] to send the auth to them.  He was very confused on the phone and said that right now he just wants to discuss his results.  I told him that I would get a note to the nurse to call.

## 2019-07-07 NOTE — Telephone Encounter (Signed)
Referral for cancer center was made for pt, he requires an auth from the [place] for this appt. Pt was called to discuss auth and he states he does not know his results and would like a call from the doctor. Dr. Tarri Glenn notified.

## 2019-07-08 NOTE — Telephone Encounter (Signed)
Patient was clear yesterday during our conversation that he needed to call his pcp at the [place] to have them set up the auth/referral to Oncology after he received his results. I will put a note in the referral for Oncology to await info from [place].

## 2019-07-09 ENCOUNTER — Other Ambulatory Visit: Payer: Self-pay

## 2019-07-09 ENCOUNTER — Encounter (HOSPITAL_COMMUNITY): Payer: Self-pay | Admitting: Emergency Medicine

## 2019-07-09 ENCOUNTER — Inpatient Hospital Stay (HOSPITAL_COMMUNITY): Payer: No Typology Code available for payment source

## 2019-07-09 ENCOUNTER — Inpatient Hospital Stay (HOSPITAL_COMMUNITY)
Admission: EM | Admit: 2019-07-09 | Discharge: 2019-07-12 | DRG: 391 | Disposition: A | Discharge status: Hospice | Payer: No Typology Code available for payment source | Attending: Internal Medicine | Admitting: Internal Medicine

## 2019-07-09 DIAGNOSIS — E871 Hypo-osmolality and hyponatremia: Secondary | ICD-10-CM | POA: Diagnosis present

## 2019-07-09 DIAGNOSIS — D72828 Other elevated white blood cell count: Secondary | ICD-10-CM | POA: Diagnosis present

## 2019-07-09 DIAGNOSIS — Z8546 Personal history of malignant neoplasm of prostate: Secondary | ICD-10-CM

## 2019-07-09 DIAGNOSIS — K767 Hepatorenal syndrome: Secondary | ICD-10-CM | POA: Diagnosis present

## 2019-07-09 DIAGNOSIS — C7989 Secondary malignant neoplasm of other specified sites: Secondary | ICD-10-CM | POA: Diagnosis not present

## 2019-07-09 DIAGNOSIS — K5903 Drug induced constipation: Secondary | ICD-10-CM | POA: Diagnosis present

## 2019-07-09 DIAGNOSIS — R180 Malignant ascites: Secondary | ICD-10-CM

## 2019-07-09 DIAGNOSIS — T402X5A Adverse effect of other opioids, initial encounter: Secondary | ICD-10-CM | POA: Diagnosis present

## 2019-07-09 DIAGNOSIS — R109 Unspecified abdominal pain: Secondary | ICD-10-CM

## 2019-07-09 DIAGNOSIS — R1011 Right upper quadrant pain: Secondary | ICD-10-CM | POA: Diagnosis not present

## 2019-07-09 DIAGNOSIS — K5900 Constipation, unspecified: Secondary | ICD-10-CM | POA: Diagnosis present

## 2019-07-09 DIAGNOSIS — Z20822 Contact with and (suspected) exposure to covid-19: Secondary | ICD-10-CM | POA: Diagnosis present

## 2019-07-09 DIAGNOSIS — N179 Acute kidney failure, unspecified: Secondary | ICD-10-CM | POA: Diagnosis present

## 2019-07-09 DIAGNOSIS — G500 Trigeminal neuralgia: Secondary | ICD-10-CM | POA: Diagnosis present

## 2019-07-09 DIAGNOSIS — R7401 Elevation of levels of liver transaminase levels: Secondary | ICD-10-CM | POA: Diagnosis not present

## 2019-07-09 DIAGNOSIS — Z7189 Other specified counseling: Secondary | ICD-10-CM | POA: Diagnosis not present

## 2019-07-09 DIAGNOSIS — Z951 Presence of aortocoronary bypass graft: Secondary | ICD-10-CM

## 2019-07-09 DIAGNOSIS — E8770 Fluid overload, unspecified: Secondary | ICD-10-CM | POA: Diagnosis present

## 2019-07-09 DIAGNOSIS — C787 Secondary malignant neoplasm of liver and intrahepatic bile duct: Secondary | ICD-10-CM | POA: Diagnosis present

## 2019-07-09 DIAGNOSIS — I2510 Atherosclerotic heart disease of native coronary artery without angina pectoris: Secondary | ICD-10-CM | POA: Diagnosis present

## 2019-07-09 DIAGNOSIS — C259 Malignant neoplasm of pancreas, unspecified: Secondary | ICD-10-CM | POA: Diagnosis present

## 2019-07-09 DIAGNOSIS — Z87891 Personal history of nicotine dependence: Secondary | ICD-10-CM

## 2019-07-09 DIAGNOSIS — Z66 Do not resuscitate: Secondary | ICD-10-CM | POA: Diagnosis present

## 2019-07-09 DIAGNOSIS — K921 Melena: Secondary | ICD-10-CM | POA: Diagnosis present

## 2019-07-09 DIAGNOSIS — R1909 Other intra-abdominal and pelvic swelling, mass and lump: Secondary | ICD-10-CM | POA: Diagnosis not present

## 2019-07-09 DIAGNOSIS — C799 Secondary malignant neoplasm of unspecified site: Secondary | ICD-10-CM

## 2019-07-09 DIAGNOSIS — Z800 Family history of malignant neoplasm of digestive organs: Secondary | ICD-10-CM

## 2019-07-09 DIAGNOSIS — K7290 Hepatic failure, unspecified without coma: Secondary | ICD-10-CM | POA: Diagnosis present

## 2019-07-09 DIAGNOSIS — G893 Neoplasm related pain (acute) (chronic): Secondary | ICD-10-CM | POA: Diagnosis present

## 2019-07-09 DIAGNOSIS — R188 Other ascites: Secondary | ICD-10-CM

## 2019-07-09 DIAGNOSIS — C689 Malignant neoplasm of urinary organ, unspecified: Secondary | ICD-10-CM | POA: Diagnosis present

## 2019-07-09 DIAGNOSIS — R627 Adult failure to thrive: Secondary | ICD-10-CM | POA: Diagnosis present

## 2019-07-09 DIAGNOSIS — Z515 Encounter for palliative care: Secondary | ICD-10-CM | POA: Diagnosis present

## 2019-07-09 DIAGNOSIS — R17 Unspecified jaundice: Secondary | ICD-10-CM

## 2019-07-09 LAB — LIPASE, BLOOD: Lipase: 53 U/L — ABNORMAL HIGH (ref 11–51)

## 2019-07-09 LAB — CBC
HCT: 42.7 % (ref 39.0–52.0)
HCT: 42.8 % (ref 39.0–52.0)
Hemoglobin: 15.3 g/dL (ref 13.0–17.0)
Hemoglobin: 15.2 g/dL (ref 13.0–17.0)
MCH: 32.3 pg (ref 26.0–34.0)
MCH: 31.7 pg (ref 26.0–34.0)
MCHC: 35.8 g/dL (ref 30.0–36.0)
MCHC: 35.5 g/dL (ref 30.0–36.0)
MCV: 90.1 fL (ref 80.0–100.0)
MCV: 89.2 fL (ref 80.0–100.0)
Platelets: 212 10*3/uL (ref 150–400)
Platelets: 200 10*3/uL (ref 150–400)
RBC: 4.74 MIL/uL (ref 4.22–5.81)
RBC: 4.8 MIL/uL (ref 4.22–5.81)
RDW: 18.9 % — ABNORMAL HIGH (ref 11.5–15.5)
RDW: 18.7 % — ABNORMAL HIGH (ref 11.5–15.5)
WBC: 12 10*3/uL — ABNORMAL HIGH (ref 4.0–10.5)
WBC: 12.7 10*3/uL — ABNORMAL HIGH (ref 4.0–10.5)
nRBC: 0 % (ref 0.0–0.2)
nRBC: 0 % (ref 0.0–0.2)

## 2019-07-09 LAB — COMPREHENSIVE METABOLIC PANEL
ALT: 101 U/L — ABNORMAL HIGH (ref 0–44)
AST: 194 U/L — ABNORMAL HIGH (ref 15–41)
Albumin: 2.5 g/dL — ABNORMAL LOW (ref 3.5–5.0)
Alkaline Phosphatase: 461 U/L — ABNORMAL HIGH (ref 38–126)
Anion gap: 16 — ABNORMAL HIGH (ref 5–15)
BUN: 12 mg/dL (ref 8–23)
CO2: 21 mmol/L — ABNORMAL LOW (ref 22–32)
Calcium: 9.1 mg/dL (ref 8.9–10.3)
Chloride: 95 mmol/L — ABNORMAL LOW (ref 98–111)
Creatinine, Ser: 1.07 mg/dL (ref 0.61–1.24)
GFR calc Af Amer: >60 mL/min (ref >60)
GFR calc non Af Amer: >60 mL/min (ref >60)
Glucose, Bld: 139 mg/dL — ABNORMAL HIGH (ref 70–99)
Potassium: 3.9 mmol/L (ref 3.5–5.1)
Sodium: 132 mmol/L — ABNORMAL LOW (ref 135–145)
Total Bilirubin: 21.8 mg/dL (ref 0.3–1.2)
Total Protein: 7.2 g/dL (ref 6.5–8.1)

## 2019-07-09 LAB — CREATININE, SERUM
Creatinine, Ser: 1.12 mg/dL (ref 0.61–1.24)
GFR calc Af Amer: >60 mL/min (ref >60)
GFR calc non Af Amer: >60 mL/min (ref >60)

## 2019-07-09 LAB — SARS CORONAVIRUS 2 BY RT PCR (HOSPITAL ORDER, PERFORMED IN ~~LOC~~ HOSPITAL LAB): SARS Coronavirus 2: NEGATIVE

## 2019-07-09 MED ORDER — TRAMADOL HCL 50 MG PO TABS
50.0000 mg | ORAL_TABLET | Freq: Four times a day (QID) | ORAL | Status: DC | PRN
  Administered 2019-07-09 – 2019-07-11 (×3): 50 mg via ORAL
  Filled 2019-07-09 (×3): qty 1

## 2019-07-09 MED ORDER — SODIUM CHLORIDE 0.9 % IV BOLUS
1000.0000 mL | Freq: Once | INTRAVENOUS | Status: AC
  Administered 2019-07-09: 1000 mL via INTRAVENOUS

## 2019-07-09 MED ORDER — MORPHINE SULFATE (PF) 2 MG/ML IV SOLN
1.0000 mg | INTRAVENOUS | Status: DC | PRN
  Administered 2019-07-11: 1 mg via INTRAVENOUS
  Filled 2019-07-09: qty 1

## 2019-07-09 MED ORDER — POLYETHYLENE GLYCOL 3350 17 GM/SCOOP PO POWD
1.0000 | Freq: Every day | ORAL | Status: DC
  Administered 2019-07-09 – 2019-07-11 (×2): 255 g via ORAL
  Filled 2019-07-09 (×2): qty 255

## 2019-07-09 MED ORDER — IBUPROFEN 400 MG PO TABS: 400.0000 mg | ORAL_TABLET | ORAL | Status: DC | PRN

## 2019-07-09 MED ORDER — ENOXAPARIN SODIUM 40 MG/0.4ML ~~LOC~~ SOLN
40.0000 mg | SUBCUTANEOUS | Status: DC
  Administered 2019-07-09 – 2019-07-11 (×3): 40 mg via SUBCUTANEOUS
  Filled 2019-07-09 (×3): qty 0.4

## 2019-07-09 MED ORDER — KETOROLAC TROMETHAMINE 30 MG/ML IJ SOLN
30.0000 mg | Freq: Once | INTRAMUSCULAR | Status: AC
  Administered 2019-07-09: 30 mg via INTRAVENOUS
  Filled 2019-07-09: qty 1

## 2019-07-09 MED ORDER — FLEET ENEMA 7-19 GM/118ML RE ENEM: 1.0000 | ENEMA | Freq: Once | RECTAL | Status: DC | PRN

## 2019-07-09 MED ORDER — BISACODYL 5 MG PO TBEC
5.0000 mg | DELAYED_RELEASE_TABLET | Freq: Every day | ORAL | Status: DC
  Administered 2019-07-09: 5 mg via ORAL
  Filled 2019-07-09: qty 1

## 2019-07-09 NOTE — ED Triage Notes (Signed)
Patient states he was recently diagnosed with liver cancer and has yet to see an oncologist. Reports abdominal distention over the past few days that has been painful. Patient appears very jaundice and bloated.

## 2019-07-09 NOTE — Progress Notes (Addendum)
ED provider called at request of Medicine service for consult on this patient with worsening abdominal pain and jaundice.  I reviewed the recent D/C summary, imaging reports and today's labs.  It sounds like he needs admission for pain control ( though that has reportedly been limited by severe OIC) and oncology evaluation.  The worsening jaundice and elevated bilirubin are from progression of the heavy tumor burden in his liver.  Unfortunately,recent imaging demonstrates that there is no discrete focus of obstruction amenable to intervention such as by ERCP or IR.  As much as we would like to be for this unfortunate man, I am afraid the GI service cannot be of any more help at this point.  Oncology consult warranted, suspect limited treatment options given advanced disease.

## 2019-07-09 NOTE — Consult Note (Addendum)
[place] CONSULT NOTE  Patient Care Team: System, Pcp Not In as PCP - General  HEME/ONC OVERVIEW: 1. Metastatic poorly differentiated carcinoma, possibly pancreatic primary -06/2019:  Innumerable lesions throughout the liver, c/w metastatic disease; borderline thoracic LN's on CT; no bladder mass or hydroureteronephrosis   Hypovascular pancreatic tail mass 3.5 x 2.8cm x 2.6cm without definite splenic vein invasion, innumerable liver lesions on MRCP  US-guided liver bx: metastatic poorly differentiated carcinoma; ddx includes urothelial carcinoma  CA19-9 10,000; urinalysis negative  ASSESSMENT & PLAN:   Metastatic poorly differentiated carcinoma, possibly pancreatic primary -I reviewed the patient's records in detail, including recent hospitalization notes, lab studies, imaging results and pathology reports -I also independently reviewed the radiologic images of recent CT and MRI, and agree with the findings as documented -In summary, the patient presented to ED in early 06/2019 for RUQ pain, light-colored stools and dark urine, and CT showed extensive liver metastases.  MRCP showed a sizable pancreatic tail mass.  He underwent liver biopsy, which showed poorly differentiated carcinoma, ddx including urothelial origin.  However, there was no bladder mass or hydroureteronephrosis, and CA19-9 was noted to be elevated at 10,000.  He was discharged with outpatient oncology referral, but returned to ED for worsening abdominal pain.  Labs were notable for severe hyperbilirubinemia (Tbili 21.8) and mild transaminitis.  Repeat CT abdomen/pelvis showed overall stable extensive liver mets.  GI did not see any focal obstruction that would amendable for intervention by ERCP or IR.  Oncology was consulted for further evaluation. -I reviewed the imaging and pathology results in detail with the patient -Given the poorly differentiated carcinoma from the liver biopsy and non-specific  immunohistochemistry, the origin of the tumor may include pancreatiobiliary, GI and urothelial.  However, given the normal UA (no hematuria), absence of bladder mass or hydroureteronephrosis, urothelial carcinoma is much less likely.  MRCP demonstrated a sizable pancreatic mass with significantly elevated CA19-9 (~10,000), which suggests a pancreatic primary malignancy. -GI was consulted on admission and did not see any focal obstruction that would be amendable for intervention, such as ERCP. -I also reviewed the images in detail with Dr. Pascal Lux of interventional radiology, who concurred with GI assessment that there was no significant biliary obstruction that would be amendable for PTC drain  -Unfortunately, his severe hyperbilirubinemia is most likely secondary to extensive tumor in the liver, and without improvement in the bilirubin, the treatment options are very limited, as most drugs are metabolized by the liver, and severe hyperbilirubinemia significantly increases the risk of toxicities   Goals of care discussion -Given the widely metastatic carcinoma, likely pancreatic primary, the goal of treatment would be for palliative intent only, NOT curative -Unfortunately, due to severe hyperbilirubinemia secondary to extensive liver metastases, the systemic therapy options are extremely limited -In light of the aggressive, advanced disease, the patient's age and comorbidities, I would recommend considering hospice  -The patient and his caregiver had already discussed this prior to this admission, and they had already decided not to pursue aggressive treatment if the prognosis was poor -I explained to the patient that given the widely metastatic disease in the liver with progressive liver failure, his life expectancy may be a few weeks to months  -After the lengthy discussion, the patient and his caregiver made an informed decision to forego treatment, and would like to focus on quality of life -I  discussed the case in detail Dr. Neysa Bonito and will consult hospice to facilitate transition toward hospice   Thank you for  the opportunity to participate in Thomas Riddle's care.  Please do not hesitate to contact me if there are any questions.   Tish Men, MD 5/14/20218:09 PM  PURPOSE OF CONSULTATION:  Metastatic carcinoma in the liver  HISTORY OF PRESENTING ILLNESS:  Thomas Riddle is a 85 yo WM w/ hx of CAD s/p CABG and history of prostate cancer who presents to [facility] ED for worsening abdominal pain.  the patient presented to ED in early 06/2019 for RUQ pain, light-colored stools and dark urine, and CT showed extensive liver metastases.  MRCP showed a sizable pancreatic tail mass.  He underwent liver biopsy, which showed poorly differentiated carcinoma, ddx including urothelial origin.  However, there was no bladder mass or hydroureteronephrosis, and CA19-9 was noted to be elevated at 10,000.  He was discharged with outpatient oncology referral, but returned to ED for worsening abdominal pain.  Labs were notable for severe hyperbilirubinemia (Tbili 21.8) and mild transaminitis.  Repeat CT abdomen/pelvis showed overall stable extensive liver mets.  GI did not see any focal obstruction that would amendable for intervention by ERCP or IR.  Oncology was consulted for further evaluation.  REVIEW OF SYSTEMS:   Constitutional: ( - ) fevers, ( - )  chills , ( - ) night sweats Eyes: ( - ) blurriness of vision, ( - ) double vision, ( - ) watery eyes Ears, nose, mouth, throat, and face: ( - ) mucositis, ( - ) sore throat Respiratory: ( - ) cough, ( - ) dyspnea, ( - ) wheezes Cardiovascular: ( - ) palpitation, ( - ) chest discomfort, ( + ) lower extremity swelling Gastrointestinal:  ( - ) nausea, ( - ) heartburn, ( + ) change in bowel habits Skin: ( + ) abnormal skin rashes Lymphatics: ( - ) new lymphadenopathy, ( - ) easy bruising Neurological: ( - ) numbness, ( - ) tingling, ( - ) new weaknesses Behavioral/Psych: ( - )  mood change, ( - ) new changes  All other systems were reviewed with the patient and are negative.  I have reviewed his chart and materials related to his cancer extensively and collaborated history with the patient. Summary of oncologic history is as follows: Oncology History   No history exists.    MEDICAL HISTORY:  Past Medical History:  Diagnosis Date  . CAD (coronary artery disease) 06/28/2019  . Hyperbilirubinemia 06/2019  . Prostate cancer [facility])     SURGICAL HISTORY: Past Surgical History:  Procedure Laterality Date  . APPENDECTOMY    . CORONARY ARTERY BYPASS GRAFT    . HERNIA REPAIR    . RADIOACTIVE SEED IMPLANT N/A    Prostate cancer    SOCIAL HISTORY: Social History   Socioeconomic History  . Marital status: Single    Spouse name: Not on file  . Number of children: Not on file  . Years of education: Not on file  . Highest education level: Not on file  Occupational History  . Not on file  Tobacco Use  . Smoking status: Former Research scientist (life sciences)  . Smokeless tobacco: Never Used  Substance and Sexual Activity  . Alcohol use: Yes  . Drug use: No  . Sexual activity: Not on file  Other Topics Concern  . Not on file  Social History Narrative  . Not on file   Social Determinants of Health   Financial Resource Strain:   . Difficulty of Paying Living Expenses:   Food Insecurity:   . Worried About Charity fundraiser in  the Last Year:   . [place] in the Last Year:   Transportation Needs:   . Film/video editor (Medical):   Marland Kitchen Lack of Transportation (Non-Medical):   Physical Activity:   . Days of Exercise per Week:   . Minutes of Exercise per Session:   Stress:   . Feeling of Stress :   Social Connections:   . Frequency of Communication with Friends and Family:   . Frequency of Social Gatherings with Friends and Family:   . Attends Religious Services:   . Active Member of Clubs or Organizations:   . Attends Archivist Meetings:   Marland Kitchen Marital  Status:   Intimate Partner Violence:   . Fear of Current or Ex-Partner:   . Emotionally Abused:   Marland Kitchen Physically Abused:   . Sexually Abused:     FAMILY HISTORY: Family History  Problem Relation Age of Onset  . Alcohol abuse Father   . Colon cancer Sister   . Lung cancer Brother     ALLERGIES:  is allergic to amoxicillin sodium and penicillins.  MEDICATIONS:  Current Facility-Administered Medications  Medication Dose Route Frequency Provider Last Rate Last Admin  . bisacodyl (DULCOLAX) EC tablet 5 mg  5 mg Oral QHS Harold Hedge, MD      . enoxaparin (LOVENOX) injection 40 mg  40 mg Subcutaneous Q24H Harold Hedge, MD      . ibuprofen (ADVIL) tablet 400 mg  400 mg Oral Q4H PRN Harold Hedge, MD      . morphine 2 MG/ML injection 1 mg  1 mg Intravenous Q4H PRN Harold Hedge, MD      . polyethylene glycol powder (GLYCOLAX/MIRALAX) container 255 g  1 Container Oral Daily Harold Hedge, MD      . sodium phosphate (FLEET) 7-19 GM/118ML enema 1 enema  1 enema Rectal Once PRN Harold Hedge, MD      . traMADol Veatrice Bourbon) tablet 50 mg  50 mg Oral Q6H PRN Harold Hedge, MD       Current Outpatient Medications  Medication Sig Dispense Refill  . bisacodyl (DULCOLAX) 5 MG EC tablet Take 1 tablet (5 mg total) by mouth at bedtime for 15 days. 15 tablet 0  . polyethylene glycol powder (GLYCOLAX/MIRALAX) 17 GM/SCOOP powder Take 255 g by mouth daily for 15 doses. 3825 g 0  . docusate sodium (COLACE) 100 MG capsule Take 1 capsule (100 mg total) by mouth 2 (two) times daily. (Patient not taking: Reported on 07/09/2019) 60 capsule 0  . oxyCODONE (OXY IR/ROXICODONE) 5 MG immediate release tablet Take 1 tablet (5 mg total) by mouth every 4 (four) hours as needed for severe pain. (Patient not taking: Reported on 07/09/2019) 20 tablet 0  . pantoprazole (PROTONIX) 40 MG tablet Take 1 tablet (40 mg total) by mouth daily. (Patient not taking: Reported on 07/09/2019) 30 tablet 0    PHYSICAL  EXAMINATION:  Vitals:   07/09/19 1730 07/09/19 1800  BP: 127/81 (!) 156/92  Pulse: 90 93  Resp: 15 20  Temp:    SpO2: 98% 98%   Filed Weights   07/09/19 1008  Weight: 165 lb 5.5 oz (75 kg)    GENERAL: alert, no distress and comfortable, frail appearing  SKIN: diffuse jaundice  EYES: conjunctiva are pink and non-injected, sclera clear OROPHARYNX: no exudate, no erythema; lips, buccal mucosa, and tongue normal  NECK: supple, non-tender LUNGS: clear to auscultation with normal breathing effort HEART: regular  rate & rhythm, no murmurs, 2+ bilateral lower extremity edema ABDOMEN: soft, non-tender, non-distended, normal bowel sounds Musculoskeletal: no cyanosis of digits and no clubbing  PSYCH: alert & oriented x 3, slightly slowed speech  LABORATORY DATA:  I have reviewed the data as listed Lab Results  Component Value Date   WBC 12.0 (H) 07/09/2019   HGB 15.3 07/09/2019   HCT 42.7 07/09/2019   MCV 90.1 07/09/2019   PLT 212 07/09/2019   Lab Results  Component Value Date   NA 132 (L) 07/09/2019   K 3.9 07/09/2019   CL 95 (L) 07/09/2019   CO2 21 (L) 07/09/2019    RADIOGRAPHIC STUDIES: I have personally reviewed the radiological images as listed and agreed with the findings in the report. DG Chest 2 View  Result Date: 06/28/2019 CLINICAL DATA:  Shortness of breath EXAM: CHEST - 2 VIEW COMPARISON:  None. FINDINGS: Post sternotomy changes. No acute consolidation or effusion. Normal heart size. Aortic atherosclerosis. No pneumothorax. IMPRESSION: No active cardiopulmonary disease. Electronically Signed   By: Donavan Foil M.D.   On: 06/28/2019 17:23   CT Chest W Contrast  Result Date: 06/28/2019 CLINICAL DATA:  Right upper quadrant pain EXAM: CT CHEST, ABDOMEN, AND PELVIS WITH CONTRAST TECHNIQUE: Multidetector CT imaging of the chest, abdomen and pelvis was performed following the standard protocol during bolus administration of intravenous contrast. CONTRAST:  164m OMNIPAQUE  IOHEXOL 300 MG/ML  SOLN COMPARISON:  None. FINDINGS: CT CHEST FINDINGS Cardiovascular: Prior CABG. Heart is normal size. Aorta is normal caliber. Aortic atherosclerosis. Mediastinum/Nodes: Numerous small and borderline sized mediastinal lymph nodes. Pretracheal lymph node has a short axis diameter of 9 mm. Other similarly sized or smaller mediastinal lymph nodes. No hilar or axillary adenopathy. Lungs/Pleura: Biapical scarring. 5 mm nodule in the left lower lobe. Linear atelectasis or scarring at the left base. No effusions. Musculoskeletal: Chest wall soft tissues are unremarkable. No acute bony abnormality. CT ABDOMEN PELVIS FINDINGS Hepatobiliary: Innumerable low-density lesions throughout the liver most compatible with metastases. Index right hepatic lesion posteriorly measures 2.5 cm. The other innumerable lesions are similar or smaller in size. Gallbladder grossly unremarkable. Pancreas: No focal abnormality or ductal dilatation. Spleen: No focal abnormality.  Normal size. Adrenals/Urinary Tract: No adrenal abnormality. No focal renal abnormality. No stones or hydronephrosis. Urinary bladder is unremarkable. Stomach/Bowel: Stomach, large and small bowel grossly unremarkable. Vascular/Lymphatic: Diffuse aortic atherosclerosis. No evidence of aneurysm or adenopathy. Reproductive: Radiation seeds in the region of the prostate. Other: Trace free fluid in the cul-de-sac.  No free air. Musculoskeletal: No acute bony abnormality. IMPRESSION: Innumerable low-density lesions throughout the liver compatible with metastases. Nonspecific borderline mediastinal lymph nodes, 9 mm or less in size. 5 mm left lower lobe pulmonary nodule. Biapical scarring.  Left base scarring or atelectasis. Aortic atherosclerosis. Electronically Signed   By: KRolm BaptiseM.D.   On: 06/28/2019 21:32   CT ABDOMEN PELVIS W CONTRAST  Result Date: 06/28/2019 CLINICAL DATA:  Right upper quadrant pain EXAM: CT CHEST, ABDOMEN, AND PELVIS WITH  CONTRAST TECHNIQUE: Multidetector CT imaging of the chest, abdomen and pelvis was performed following the standard protocol during bolus administration of intravenous contrast. CONTRAST:  1028mOMNIPAQUE IOHEXOL 300 MG/ML  SOLN COMPARISON:  None. FINDINGS: CT CHEST FINDINGS Cardiovascular: Prior CABG. Heart is normal size. Aorta is normal caliber. Aortic atherosclerosis. Mediastinum/Nodes: Numerous small and borderline sized mediastinal lymph nodes. Pretracheal lymph node has a short axis diameter of 9 mm. Other similarly sized or smaller mediastinal lymph nodes. No hilar  or axillary adenopathy. Lungs/Pleura: Biapical scarring. 5 mm nodule in the left lower lobe. Linear atelectasis or scarring at the left base. No effusions. Musculoskeletal: Chest wall soft tissues are unremarkable. No acute bony abnormality. CT ABDOMEN PELVIS FINDINGS Hepatobiliary: Innumerable low-density lesions throughout the liver most compatible with metastases. Index right hepatic lesion posteriorly measures 2.5 cm. The other innumerable lesions are similar or smaller in size. Gallbladder grossly unremarkable. Pancreas: No focal abnormality or ductal dilatation. Spleen: No focal abnormality.  Normal size. Adrenals/Urinary Tract: No adrenal abnormality. No focal renal abnormality. No stones or hydronephrosis. Urinary bladder is unremarkable. Stomach/Bowel: Stomach, large and small bowel grossly unremarkable. Vascular/Lymphatic: Diffuse aortic atherosclerosis. No evidence of aneurysm or adenopathy. Reproductive: Radiation seeds in the region of the prostate. Other: Trace free fluid in the cul-de-sac.  No free air. Musculoskeletal: No acute bony abnormality. IMPRESSION: Innumerable low-density lesions throughout the liver compatible with metastases. Nonspecific borderline mediastinal lymph nodes, 9 mm or less in size. 5 mm left lower lobe pulmonary nodule. Biapical scarring.  Left base scarring or atelectasis. Aortic atherosclerosis.  Electronically Signed   By: Rolm Baptise M.D.   On: 06/28/2019 21:32   US BIOPSY (LIVER)  Result Date: 07/01/2019 INDICATION: 85 year old male with multifocal hepatic lesions concerning for metastatic disease. He does have a history of prostate cancer. He presents for ultrasound-guided biopsy of a liver lesion. EXAM: ULTRASOUND BIOPSY CORE LIVER MEDICATIONS: None. ANESTHESIA/SEDATION: Moderate (conscious) sedation was employed during this procedure. A total of Versed 1 mg and Fentanyl 50 mcg was administered intravenously. Moderate Sedation Time: 11 minutes. The patient's level of consciousness and vital signs were monitored continuously by radiology nursing throughout the procedure under my direct supervision. FLUOROSCOPY TIME:  None. COMPLICATIONS: None immediate. PROCEDURE: Informed written consent was obtained from the patient after a thorough discussion of the procedural risks, benefits and alternatives. All questions were addressed. A timeout was performed prior to the initiation of the procedure. Ultrasound was used to interrogate the liver. There are multiple subtly hypoechoic solid lesions scattered throughout the liver. A suitable lesion was identified and a skin entry site selected and marked. The skin was sterilely prepped and draped in the standard fashion using chlorhexidine skin prep. Local anesthesia was attained by infiltration with 1% lidocaine. A small dermatotomy was made. Under real-time ultrasound guidance, a 17 gauge introducer needle was advanced through the liver and positioned at the margin of the lesion. Multiple 18 gauge core biopsies were coaxially obtained using the BioPince automated biopsy device. Biopsy specimens were placed in formalin and delivered to pathology for further analysis. As the introducer needle was removed, the biopsy tract was embolized with a Gel-Foam slurry. IMPRESSION: Technically successful ultrasound-guided core biopsy of liver lesion. Electronically Signed    By: Jacqulynn Cadet M.D.   On: 07/01/2019 14:37   MR ABDOMEN MRCP W WO CONTAST  Result Date: 06/29/2019 CLINICAL DATA:  85 year old male with history of right upper quadrant abdominal pain for the past 3 weeks. Dark colored urine and light colored stool. Acute hepatitis. Multiple hypovascular hepatic lesions noted on recent CT the abdomen and pelvis. EXAM: MRI ABDOMEN WITHOUT AND WITH CONTRAST (INCLUDING MRCP) TECHNIQUE: Multiplanar multisequence MR imaging of the abdomen was performed both before and after the administration of intravenous contrast. Heavily T2-weighted images of the biliary and pancreatic ducts were obtained, and three-dimensional MRCP images were rendered by post processing. CONTRAST:  72m GADAVIST GADOBUTROL 1 MMOL/ML IV SOLN COMPARISON:  No prior abdominal MRI. CT the abdomen and pelvis 06/28/2019.  FINDINGS: Lower chest: Scarring in the posterior aspect of the left lower lobe, better demonstrated on recent chest CT. Hepatobiliary: Innumerable T1 hypointense, T2 hyperintense hypovascular lesions scattered throughout the hepatic parenchyma, indicative of widespread metastatic disease. The largest of these lesions is noted in the central aspect of the liver between segments 4A and 8 (axial image 13 of series 8) measuring 3.6 x 2.3 cm. No intra or extrahepatic biliary ductal dilatation. Gallbladder is nearly decompressed. Gallbladder wall appears mildly thickened and edematous. No filling defects in the gallbladder to suggest cholelithiasis. Pancreas: There is a poorly defined mass in the tail of the pancreas which is slightly T1 hypointense, T2 hypointense and appears hypovascular on post gadolinium imaging measuring 3.5 x 2.8 x 2.6 cm (axial image 54 of series 33 and coronal image 35 of series 39). This is intimately associated with the splenic hilum, without definite evidence of direct splenic invasion. No pancreatic ductal dilatation. No pancreatic or peripancreatic fluid collections or  inflammatory changes. Spleen: Mass in tail of pancreas intimately associated with the splenic hilum without evidence of direct invasion. Adrenals/Urinary Tract: Subcentimeter T1 hypointense, T2 hyperintense, nonenhancing lesions in both kidneys, compatible with tiny simple cysts. No hydroureteronephrosis in the visualized portions of the abdomen. Bilateral adrenal glands are normal in appearance. Stomach/Bowel: Visualized portions are unremarkable. Vascular/Lymphatic: Aortic atherosclerosis, without evidence of aneurysm in the abdominal vasculature. Lesion in the tail of the pancreas is intimately associated with the splenic artery and vein. Prominent but nonenlarged portacaval lymph node measuring 9 mm in short axis which demonstrates diffusion restriction. Other: No significant volume of ascites noted in the visualized portions of the peritoneal cavity. Musculoskeletal: No suspicious osseous lesions noted in the visualized portions of the skeleton. IMPRESSION: 1. 3.5 x 2.8 x 2.6 cm hypovascular mass in the tail of the pancreas intimately associated with the splenic hilum without definite evidence of direct splenic invasion. Correlation with endoscopic ultrasound and biopsy is recommended to establish a tissue diagnosis. 2. Innumerable lesions noted throughout the liver compatible with widespread metastatic disease. 3. Nonenlarged but suspicious 9 mm short axis portacaval lymph node which demonstrates diffusion restriction, likely to represent nodal metastasis. 4. Aortic atherosclerosis. Electronically Signed   By: Vinnie Langton M.D.   On: 06/29/2019 11:16   US Abdomen Limited RUQ  Result Date: 07/06/2019 CLINICAL DATA:  Right upper quadrant pain. Hepatic metastatic disease. EXAM: ULTRASOUND ABDOMEN LIMITED RIGHT UPPER QUADRANT COMPARISON:  CT 06/28/2019 FINDINGS: Gallbladder: With diffuse gallbladder wall thickening measuring up to 4 mm. 4 mm echogenic, nonshadowing focus along the non dependent portion of  the gallbladder lumen which may reflect a polyp or small impacted stone. No sonographic Murphy sign noted by sonographer. Common bile duct: Diameter: 4 mm Liver: Innumerable rounded heterogeneously hypoechoic masses throughout the right and left hepatic lobes. Within normal limits in parenchymal echogenicity. Portal vein is patent on color Doppler imaging with normal direction of blood flow towards the liver. Other: None. IMPRESSION: 1. Mild circumferential gallbladder wall thickening, nonspecific in can be seen in the setting of diffuse hepatocellular disease, low protein states, as well as cholecystitis. No pericholecystic fluid. Negative sonographic Murphy sign. If there is clinical suspicion for cholecystitis, a nuclear medicine hepatobiliary scan could be performed. 2. Small probable gallbladder wall polyp. 3. Diffuse hepatic metastatic disease, as seen on previous studies. Electronically Signed   By: Davina Poke D.O.   On: 07/06/2019 10:20    PATHOLOGY: I have reviewed the pathology reports as documented in the oncologist history.

## 2019-07-09 NOTE — H&P (Addendum)
History and Physical        Hospital Admission Note Date: 07/09/2019  Patient name: Thomas Riddle Medical record number: [id number] Date of birth: 1934/12/30 Age: 85 y.o. Gender: male  PCP: System, Pcp Not In  Patient coming from: Home Lives with: Significant other At baseline, ambulates: Independently  Chief Complaint    Chief Complaint  Patient presents with  . Abdominal Swelling      HPI:   This is a very pleasant 85 year old male with history of recently diagnosed urothelial cancer with mets to liver and pancreas during recent hospitalization from 5/3-5/7, prostate cancer with radioactive seed implant, CAD s/p CABG, trigeminal neuralgia who reported constipation post discharge at which time he called our GI and was told to take MiraLAX and was evaluated in the ED on 5/11 for opiate-induced constipation and evaluated by GI at that time without any further recommendations and discharged from the ED who has been having increasing abdominal distention and pain over the past several days with jaundice prompting him to return to the ED today, 5/14.  Also admits to new onset lower extremity edema over the past week.  Unfortunately he is not been able to establish care with oncology since his recent discharge.  ED course: Elevated T bili 21.8 (13 previously), transaminitis with alk phos 461, AST 194, ALT 101.  Dr. Simona Huh, GI, was notified by ED who reported that there was unfortunately no discrete focus of obstruction amenable to intervention and did not have any further recommendations other than oncology eval and pain control.  Given 1 L NS bolus and Toradol 30 mg x 1 and admitted to [facility] service.    Vitals:   07/09/19 1700 07/09/19 1730  BP: 130/75 127/81  Pulse: 87 90  Resp: 16 15  Temp:    SpO2: 97% 98%     Review of Systems:  Review of Systems  Constitutional: Negative for  chills and fever.  Respiratory: Negative for cough and shortness of breath.   Cardiovascular: Negative for chest pain and palpitations.  Gastrointestinal: Positive for abdominal pain and constipation. Negative for nausea and vomiting.       Having some bowel movements with intermittent constipation  Genitourinary: Negative.  Negative for dysuria.  Musculoskeletal: Negative.        Lower extremity edema  Skin:       Jaundice  Neurological: Negative for dizziness and tingling.  All other systems reviewed and are negative.   Medical/Social/Family History   Past Medical History: Past Medical History:  Diagnosis Date  . CAD (coronary artery disease) 06/28/2019  . Hyperbilirubinemia 06/2019  . Prostate cancer [facility])     Past Surgical History:  Procedure Laterality Date  . APPENDECTOMY    . CORONARY ARTERY BYPASS GRAFT    . HERNIA REPAIR    . RADIOACTIVE SEED IMPLANT N/A    Prostate cancer    Medications: Prior to Admission medications   Medication Sig Start Date End Date Taking? Authorizing Provider  acetaminophen (TYLENOL) 325 MG tablet Take 650 mg by mouth in the morning and at bedtime.   Yes [provider]  bisacodyl (DULCOLAX) 5 MG EC tablet Take 1 tablet (5 mg total) by mouth at bedtime  for 15 days. 07/06/19 07/21/19 Yes Carmon Sails J, PA-C  polyethylene glycol powder (GLYCOLAX/MIRALAX) 17 GM/SCOOP powder Take 255 g by mouth daily for 15 doses. 07/06/19 07/21/19 Yes Kinnie Feil, PA-C  docusate sodium (COLACE) 100 MG capsule Take 1 capsule (100 mg total) by mouth 2 (two) times daily. Patient not taking: Reported on 07/09/2019 07/02/19 08/01/19  Donne Hazel, MD  oxyCODONE (OXY IR/ROXICODONE) 5 MG immediate release tablet Take 1 tablet (5 mg total) by mouth every 4 (four) hours as needed for severe pain. Patient not taking: Reported on 07/09/2019 07/02/19   Donne Hazel, MD  pantoprazole (PROTONIX) 40 MG tablet Take 1 tablet (40 mg total) by mouth daily. Patient  not taking: Reported on 07/09/2019 07/03/19 08/02/19  Donne Hazel, MD    Allergies:   Allergies  Allergen Reactions  . Amoxicillin Sodium Rash    Full body rash  . Penicillins Rash    amoxicillins     Social History:  reports that he has quit smoking. He has never used smokeless tobacco. He reports current alcohol use. He reports that he does not use drugs.  Family History: Family History  Problem Relation Age of Onset  . Alcohol abuse Father   . Colon cancer Sister   . Lung cancer Brother      Objective   Physical Exam: Blood pressure 127/81, pulse 90, temperature 97.6 F (36.4 C), temperature source Oral, resp. rate 15, height 5' 9"  (1.753 m), weight 75 kg, SpO2 98 %.  Physical Exam Vitals and nursing note reviewed.  Constitutional:      General: He is not in acute distress.    Appearance: He is not diaphoretic.  HENT:     Head: Normocephalic.     Mouth/Throat:     Mouth: Mucous membranes are dry.  Eyes:     General: Scleral icterus present.  Cardiovascular:     Rate and Rhythm: Normal rate and regular rhythm.  Pulmonary:     Effort: Pulmonary effort is normal.     Breath sounds: Normal breath sounds.  Abdominal:     General: There is distension.     Tenderness: There is abdominal tenderness.  Musculoskeletal:     Right lower leg: Edema present.     Left lower leg: Edema present.     Comments: 2+ bilateral lower extremity edema  Skin:    Coloration: Skin is jaundiced.  Neurological:     General: No focal deficit present.     Mental Status: Mental status is at baseline.  Psychiatric:        Mood and Affect: Mood normal.        Behavior: Behavior normal.        Thought Content: Thought content normal.        Judgment: Judgment normal.     LABS on Admission: I have personally reviewed all the labs and imaging below    Basic Metabolic Panel: Recent Labs  Lab 07/05/19 1942 07/09/19 1014  NA 130* 132*  K 4.3 3.9  CL 90* 95*  CO2 25 21*  GLUCOSE  139* 139*  BUN 9 12  CREATININE 1.05 1.07  CALCIUM 9.0 9.1   Liver Function Tests: Recent Labs  Lab 07/05/19 1942 07/09/19 1014  AST 189* 194*  ALT 107* 101*  ALKPHOS 474* 461*  BILITOT 16.0* 21.8*  PROT 7.3 7.2  ALBUMIN 2.8* 2.5*   Recent Labs  Lab 07/05/19 1942 07/09/19 1014  LIPASE 24 53*   No  results for input(s): AMMONIA in the last 168 hours. CBC: Recent Labs  Lab 07/05/19 1942 07/05/19 1942 07/09/19 1014  WBC 10.4  --  12.0*  HGB 15.0  --  15.3  HCT 43.3  --  42.7  MCV 92.1   < > 90.1  PLT 189  --  212   < > = values in this interval not displayed.   Cardiac Enzymes: No results for input(s): CKTOTAL, CKMB, CKMBINDEX, TROPONINI in the last 168 hours. BNP: Invalid input(s): POCBNP CBG: No results for input(s): GLUCAP in the last 168 hours.  Radiological Exams on Admission:  No results found.    EKG: Independently reviewed.  Sinus rhythm with right axis deviation and PACs without pathologic ST's, at baseline   A & P   Active Problems:   * No active hospital problems. *   1. Abdominal pain and swelling in setting of metastatic cancer to liver and pancreas, suspect secondary to opiate induced constipation a. Recent RUQ ultrasound on 5/11 with nonspecific mild circumferential gallbladder wall thickening and gallbladder wall polyp as well as diffuse hepatic metastatic disease b. Liver biopsy from 5/6+ for poorly differentiated carcinoma c. GI notified by ED, unfortunately no intervention or recommendations from their standpoint d. Unfortunately has not been able to have formal oncology eval outpatient since his diagnosis e. Oncology consulted, discussed with Dr. Maylon Peppers f. Palliative care consult for symptom management and support g. CT abdomen pelvis without contrast to evaluate for any progression.  Suspect mass-effect leading to lower extremity edema h. Reports he is having bowel movements with his home regimen of Dulcolax and GlycoLax.  We will  continue for now and add on Fleet enema for severe constipation i. Hold Tyelnol with increased LFTs j. Motrin for mild pain/fever, tramadol for moderate pain, morphine for severe pain  2. Transaminitis with jaundice a. Likely from worsening malignancy b. No intervention warranted per GI   3. Leukocytosis, likely reactive to above a. Continue to monitor and treat underlying conditions  4. History of CAD s/p CABG a. Not on any home medications for this   DVT prophylaxis: Lovenox   Code Status: Prior  Diet: Full liquid diet Family Communication: Admission, patients condition and plan of care including tests being ordered have been discussed with the patient who indicates understanding and agrees with the plan and Code Status. Patient's significant other was updated  Disposition Plan: The appropriate patient status for this patient is INPATIENT. Inpatient status is judged to be reasonable and necessary in order to provide the required intensity of service to ensure the patient's safety. The patient's presenting symptoms, physical exam findings, and initial radiographic and laboratory data in the context of their chronic comorbidities is felt to place them at high risk for further clinical deterioration. Furthermore, it is not anticipated that the patient will be medically stable for discharge from the hospital within 2 midnights of admission. The following factors support the patient status of inpatient.   " The patient's presenting symptoms include abdominal pain/distension. " The worrisome physical exam findings include jaundice, abdominal distention, abdominal pain. " The initial radiographic and laboratory data are worrisome because of transaminitis. " The chronic co-morbidities include metastatic cancer.   * I certify that at the point of admission it is my clinical judgment that the patient will require inpatient hospital care spanning beyond 2 midnights from the point of admission due  to high intensity of service, high risk for further deterioration and high frequency of surveillance required.*  The medical decision making on this patient was of high complexity and the patient is at high risk for clinical deterioration, therefore this is a level 2  admission.  Consultants  . Palliative care . Oncology . GI  Procedures  . None  Time Spent on Admission: 66 minutes    Harold Hedge, DO [facility] Pager [phone] 07/09/2019, 5:45 PM

## 2019-07-09 NOTE — ED Provider Notes (Signed)
[facility] EMERGENCY DEPARTMENT Provider Note   CSN: [id number] Arrival date & time: 07/09/19  9485     History Chief Complaint  Patient presents with  . Abdominal Swelling    Thomas Riddle is a 85 y.o. male.  The history is provided by the patient and medical records. No language interpreter was used.     85 year old male with history of recently diagnosed urothelial cancer with mets to liver and pancreas presenting for evaluation of abdominal pain.  Patient states 11 days ago he developed upper abdominal pain.  He went to the ER and subsequently was admitted to the hospital due to elevated total bilirubin.  He had a work-up done and was found to have cancer.  He was subsequently discharged home with pain medication but states the medication caused constipation which has caused him great discomfort.  He has been taking stool softener and has had several bowel movement but complaining of increased abdominal swelling as well as having persistent abdominal tightness and discomfort.  He has attempt to reach out to an oncologist but have not had a follow-up appointment yet.  He is here requesting to be seen by oncologist for further management of his condition.  He report opiate pain medication is not ideal for him as it contribute to constipation.  He does not complain of any fever chills nausea or vomiting or dysuria however states that he is urinating less.  He have noticed light-colored stools and dark-colored urine as well as changes to his skin.  Patient lives at home but does have a friend who serves as a Building control surveyor.  His doctor in the [place].  He has not had Covid vaccine.  Past Medical History:  Diagnosis Date  . CAD (coronary artery disease) 06/28/2019  . Hyperbilirubinemia 06/2019  . Prostate cancer [facility])     Patient Active Problem List   Diagnosis Date Noted  . Pancreatic mass   . Jaundice   . Hyperbilirubinemia 06/28/2019  . Hyperglycemia 06/28/2019  . CAD  (coronary artery disease) 06/28/2019  . Liver masses 06/28/2019    Past Surgical History:  Procedure Laterality Date  . APPENDECTOMY    . CORONARY ARTERY BYPASS GRAFT    . HERNIA REPAIR    . RADIOACTIVE SEED IMPLANT N/A    Prostate cancer       Family History  Problem Relation Age of Onset  . Alcohol abuse Father   . Colon cancer Sister   . Lung cancer Brother     Social History   Tobacco Use  . Smoking status: Former Research scientist (life sciences)  . Smokeless tobacco: Never Used  Substance Use Topics  . Alcohol use: Yes  . Drug use: No    Home Medications Prior to Admission medications   Medication Sig Start Date End Date Taking? Authorizing Provider  acetaminophen (TYLENOL) 325 MG tablet Take 650 mg by mouth in the morning and at bedtime.   Yes [provider]  bisacodyl (DULCOLAX) 5 MG EC tablet Take 1 tablet (5 mg total) by mouth at bedtime for 15 days. 07/06/19 07/21/19 Yes Carmon Sails J, PA-C  polyethylene glycol powder (GLYCOLAX/MIRALAX) 17 GM/SCOOP powder Take 255 g by mouth daily for 15 doses. 07/06/19 07/21/19 Yes Kinnie Feil, PA-C  docusate sodium (COLACE) 100 MG capsule Take 1 capsule (100 mg total) by mouth 2 (two) times daily. Patient not taking: Reported on 07/09/2019 07/02/19 08/01/19  Donne Hazel, MD  oxyCODONE (OXY IR/ROXICODONE) 5 MG immediate release tablet Take  1 tablet (5 mg total) by mouth every 4 (four) hours as needed for severe pain. Patient not taking: Reported on 07/09/2019 07/02/19   Donne Hazel, MD  pantoprazole (PROTONIX) 40 MG tablet Take 1 tablet (40 mg total) by mouth daily. Patient not taking: Reported on 07/09/2019 07/03/19 08/02/19  Donne Hazel, MD    Allergies    Amoxicillin sodium and Penicillins  Review of Systems   Review of Systems  All other systems reviewed and are negative.   Physical Exam Updated Vital Signs BP 132/69 (BP Location: Left Arm)   Pulse 83   Temp 97.6 F (36.4 C) (Oral)   Resp 17   Ht 5' 9"  (1.753 m)    Wt 75 kg   SpO2 97%   BMI 24.42 kg/m   Physical Exam Vitals and nursing note reviewed.  Constitutional:      General: He is not in acute distress.    Appearance: He is well-developed.     Comments: Elderly male appears very jaundiced but in no acute discomfort.  HENT:     Head: Atraumatic.  Eyes:     General: Scleral icterus present.     Conjunctiva/sclera: Conjunctivae normal.  Cardiovascular:     Rate and Rhythm: Normal rate and regular rhythm.     Pulses: Normal pulses.     Heart sounds: Normal heart sounds.  Pulmonary:     Effort: Pulmonary effort is normal.     Breath sounds: Normal breath sounds.  Abdominal:     General: Abdomen is flat. There is distension.     Palpations: Abdomen is soft.     Tenderness: There is abdominal tenderness (Mild diffuse abdominal tenderness without guarding or rebound tenderness.).  Musculoskeletal:     Cervical back: Neck supple.  Skin:    Coloration: Skin is jaundiced.     Findings: No rash.  Neurological:     Mental Status: He is alert and oriented to person, place, and time.  Psychiatric:        Mood and Affect: Mood normal.     ED Results / Procedures / Treatments   Labs (all labs ordered are listed, but only abnormal results are displayed) Labs Reviewed  LIPASE, BLOOD - Abnormal; Notable for the following components:      Result Value   Lipase 53 (*)    All other components within normal limits  COMPREHENSIVE METABOLIC PANEL - Abnormal; Notable for the following components:   Sodium 132 (*)    Chloride 95 (*)    CO2 21 (*)    Glucose, Bld 139 (*)    Albumin 2.5 (*)    AST 194 (*)    ALT 101 (*)    Alkaline Phosphatase 461 (*)    Total Bilirubin 21.8 (*)    Anion gap 16 (*)    All other components within normal limits  CBC - Abnormal; Notable for the following components:   WBC 12.0 (*)    RDW 18.9 (*)    All other components within normal limits  SARS CORONAVIRUS 2 BY RT PCR [facility] ORDER, [place] [facility])    EKG EKG Interpretation  Date/Time:  Friday Jul 09 2019 09:59:44 EDT Ventricular Rate:  92 PR Interval:  168 QRS Duration: 84 QT Interval:  352 QTC Calculation: 435 R Axis:   121 Text Interpretation: Sinus rhythm with Premature atrial complexes Right axis deviation Possible Right ventricular hypertrophy Abnormal ECG Confirmed by Davonna Belling [phone]) on 07/09/2019  3:43:42 PM   Radiology No results found.  Procedures Procedures (including critical care time)  Medications Ordered in ED Medications  sodium chloride 0.9 % bolus 1,000 mL (1,000 mLs Intravenous New Bag/Given 07/09/19 1546)  ketorolac (TORADOL) 30 MG/ML injection 30 mg (30 mg Intravenous Given 07/09/19 1546)    ED Course  I have reviewed the triage vital signs and the nursing notes.  Pertinent labs & imaging results that were available during my care of the patient were reviewed by me and considered in my medical decision making (see chart for details).    MDM Rules/Calculators/A&P                      BP 133/74 (BP Location: Left Arm)   Pulse 86   Temp 97.6 F (36.4 C) (Oral)   Resp 17   Ht 5' 9"  (1.753 m)   Wt 75 kg   SpO2 98%   BMI 24.42 kg/m   Final Clinical Impression(s) / ED Diagnoses Final diagnoses:  Failure to thrive in adult  Malignant neoplasm metastatic to other site [facility])  Jaundice  Transaminitis  Malignant ascites    Rx / DC Orders ED Discharge Orders    None     3:26 PM This is a patient recently diagnosed with metastatic urothelial cancer presenting with complaints of abdominal pain and bloatedness.  Patient found to be very jaundiced.  Previously admitted for an elevated total bili of 13.  Today, his total bili is 21.8.  Evidence of transaminitis with AST 194, ALT 1 1, alk phos of 461 likely indicating an obstructive pathology.  Lipase is 53.  Normal renal function.  For his pain discomfort, I offer opiate pain medication but patient declined stating  that it caused constipation.  Plan to consult oncology for recommendation.  We will also give IV fluid.  Care discussed with Dr. Alvino Chapel.   5:15 PM   In the setting of Failure to thrive, worsening condition, small ascites, and the needs for oncology evaluation will consult medicine for admission. Will consult GI as well.  5:27 PM Appreciate consultation from medicine who agrees to see and admit pt.  Screening covid test ordered.  5:44 PM I have consulted [facility], Dr. Pincus Sanes, who will be involve in pt care.   Thomas Riddle was evaluated in Emergency Department on 07/09/2019 for the symptoms described in the history of present illness. He was evaluated in the context of the global COVID-19 pandemic, which necessitated consideration that the patient might be at risk for infection with the SARS-CoV-2 virus that causes COVID-19. Institutional protocols and algorithms that pertain to the evaluation of patients at risk for COVID-19 are in a state of rapid change based on information released by regulatory bodies including the CDC and federal and state organizations. These policies and algorithms were followed during the patient's care in the ED.    Domenic Moras, PA-C 07/09/19 1745    Davonna Belling, MD 07/09/19 [phone]

## 2019-07-10 ENCOUNTER — Inpatient Hospital Stay (HOSPITAL_COMMUNITY): Payer: No Typology Code available for payment source

## 2019-07-10 DIAGNOSIS — R1909 Other intra-abdominal and pelvic swelling, mass and lump: Secondary | ICD-10-CM

## 2019-07-10 DIAGNOSIS — R1011 Right upper quadrant pain: Secondary | ICD-10-CM

## 2019-07-10 DIAGNOSIS — R7401 Elevation of levels of liver transaminase levels: Secondary | ICD-10-CM

## 2019-07-10 DIAGNOSIS — C7989 Secondary malignant neoplasm of other specified sites: Secondary | ICD-10-CM

## 2019-07-10 DIAGNOSIS — Z7189 Other specified counseling: Secondary | ICD-10-CM

## 2019-07-10 LAB — COMPREHENSIVE METABOLIC PANEL
ALT: 87 U/L — ABNORMAL HIGH (ref 0–44)
AST: 186 U/L — ABNORMAL HIGH (ref 15–41)
Albumin: 2 g/dL — ABNORMAL LOW (ref 3.5–5.0)
Alkaline Phosphatase: 431 U/L — ABNORMAL HIGH (ref 38–126)
Anion gap: 12 (ref 5–15)
BUN: 16 mg/dL (ref 8–23)
CO2: 22 mmol/L (ref 22–32)
Calcium: 8.6 mg/dL — ABNORMAL LOW (ref 8.9–10.3)
Chloride: 96 mmol/L — ABNORMAL LOW (ref 98–111)
Creatinine, Ser: 1.28 mg/dL — ABNORMAL HIGH (ref 0.61–1.24)
GFR calc Af Amer: 60 mL/min — ABNORMAL LOW (ref >60)
GFR calc non Af Amer: 51 mL/min — ABNORMAL LOW (ref >60)
Glucose, Bld: 159 mg/dL — ABNORMAL HIGH (ref 70–99)
Potassium: 3.6 mmol/L (ref 3.5–5.1)
Sodium: 130 mmol/L — ABNORMAL LOW (ref 135–145)
Total Bilirubin: 19.1 mg/dL (ref 0.3–1.2)
Total Protein: 5.8 g/dL — ABNORMAL LOW (ref 6.5–8.1)

## 2019-07-10 LAB — CBC
HCT: 39.1 % (ref 39.0–52.0)
Hemoglobin: 14.1 g/dL (ref 13.0–17.0)
MCH: 32.4 pg (ref 26.0–34.0)
MCHC: 36.1 g/dL — ABNORMAL HIGH (ref 30.0–36.0)
MCV: 89.9 fL (ref 80.0–100.0)
Platelets: 158 10*3/uL (ref 150–400)
RBC: 4.35 MIL/uL (ref 4.22–5.81)
RDW: 18.6 % — ABNORMAL HIGH (ref 11.5–15.5)
WBC: 9.3 10*3/uL (ref 4.0–10.5)
nRBC: 0 % (ref 0.0–0.2)

## 2019-07-10 NOTE — ED Notes (Signed)
Provider messaged regarding pt's/staff's confusion of procedural time & diet/potential for NPO order.

## 2019-07-10 NOTE — ED Notes (Signed)
Pt ambulated to RR9 with no assistance. NAD noted, gait steady & even.

## 2019-07-10 NOTE — Progress Notes (Signed)
PROGRESS NOTE    Thomas Riddle    Code Status: DNR  OXB:[id number] DOB: [birth year] DOA: 07/09/2019 LOS: 1 days  PCP: System, Pcp Not In CC:  Chief Complaint  Patient presents with  . Abdominal Swelling       Hospital Summary   This is a very pleasant 85 year old male with history of recently diagnosed metastatic cancer with unknown primary (suspected pancreatic) with mets to liver and pancreas during recent hospitalization from 5/3-5/7, prostate cancer with radioactive seed implant, CAD s/p CABG, trigeminal neuralgia who reported constipation post discharge at which time he called [facility] and was told to take MiraLAX and was evaluated in the ED on 5/11 for opiate-induced constipation and evaluated by GI at that time without any further recommendations and discharged from the ED who has been having increasing abdominal distention and pain over the past several days with jaundice prompting him to return to the ED on 5/14.  Also admitted to new onset lower extremity edema over the past week.  Unfortunately he is not been able to establish care with oncology since his recent discharge.  ED course: Elevated T bili 21.8 (13 previously), transaminitis with alk phos 461, AST 194, ALT 101.  Dr. Loletha Carrow, GI, was notified by ED who reported that there was unfortunately no discrete focus of obstruction amenable to intervention and did not have any further recommendations other than oncology eval and pain control.  Given 1 L NS bolus and Toradol 30 mg x 1 and admitted to [facility] service with oncology and palliative care consult.  CT scan of the abdomen showed small amount of ascites which was not amenable to ultrasound-guided paracentesis.     A & P   Principal Problem:   Abdominal pain Active Problems:   Hyperbilirubinemia   Constipation   Transaminitis   Metastatic cancer (HCC)   Goals of care, counseling/discussion     Abdominal pain and swelling, suspect multifactorial: Cancer related pain, opiate  induced constipation, abdominal distention  1. Recent RUQ ultrasound on 5/11 with nonspecific mild circumferential gallbladder wall thickening and gallbladder wall polyp as well as diffuse hepatic metastatic disease 2. GI notified by ED, unfortunately no intervention or recommendations from their standpoint 3. CT abdomen pelvis: Redemonstration of metastatic disease as well as interval development of small volume ascites 4. Amount of ascites is insufficient for US guided paracentesis 5. Unfortunately limited options for pain management given liver failure and AKI.  Continue with opiates for now with bowel regimen.   6. Appreciate palliative care recommendations  Recently diagnosed metastatic poorly differentiated carcinoma possibly pancreatic primary 1. CA19-9: 10,000, CEA 62 2. Evaluated by oncology today: Limited chemotherapeutic options and recommending hospice 3. Palliative care consulted, appreciate further recommendations  Liver failure secondary to metastatic disease with jaundice 1. No intervention warranted per GI, as above 2. LFTs improved from yesterday 3. Continue to trend for now 4. Holding tylenol for now  Lower extremity edema secondary to hepatic failure 1. Compression stockings and consider Lasix pending AKI  Hyponatremia, suspect secondary to liver failure 1. Follow-up in a.m., consider Lasix if worsening sodium and renal function  AKI 1. Likely from NSAIDs 2. Hold nephrotoxic agents   Reactive leukocytosis resolved  History of CAD s/p CABG Not on any home medications for this  DVT prophylaxis: Lovenox Family Communication: Patient's significant other at bedside has been updated  Disposition Plan:  Status is: Inpatient  Remains inpatient appropriate because:Ongoing active pain requiring inpatient pain management and Awaiting palliative care  recommendations   Dispo: The patient is from: Home              Anticipated d/c is to: Home               Anticipated d/c date is: 1 day              Patient currently is not medically stable to d/c.           Pressure injury documentation    None  Consultants  GI Oncology Palliative care   Procedures  None  Antibiotics   Anti-infectives (From admission, onward)   None        Subjective   Patient seen and examined at bedside in no acute distress and resting comfortably. No acute events overnight.  Pain has improved and admits to having bowel movements.  Ambulating.  Asking to advance diet to regular  Objective   Vitals:   07/10/19 0315 07/10/19 0700 07/10/19 0804 07/10/19 1056  BP: 123/78 (!) 149/77 135/70 135/72  Pulse: 82 91 96 93  Resp: 16 16 (!) 21 18  Temp:   (!) 97.4 F (36.3 C) 97.6 F (36.4 C)  TempSrc:   Axillary Oral  SpO2: 96% 99% 97% 98%  Weight:      Height:        Intake/Output Summary (Last 24 hours) at 07/10/2019 1302 Last data filed at 07/09/2019 1752 Gross per 24 hour  Intake 1000 ml  Output --  Net 1000 ml   Filed Weights   07/09/19 1008  Weight: 75 kg    Examination:  Physical Exam Vitals and nursing note reviewed.  Constitutional:      Appearance: Normal appearance.  HENT:     Head: Normocephalic and atraumatic.  Eyes:     General: Scleral icterus present.  Cardiovascular:     Rate and Rhythm: Normal rate and regular rhythm.  Pulmonary:     Effort: Pulmonary effort is normal.     Breath sounds: Normal breath sounds.  Abdominal:     General: There is distension.  Musculoskeletal:        General: Swelling present. No tenderness.     Right lower leg: Edema present.     Left lower leg: Edema present.  Skin:    Coloration: Skin is not jaundiced or pale.  Neurological:     Mental Status: He is alert. Mental status is at baseline.  Psychiatric:        Mood and Affect: Mood normal.        Behavior: Behavior normal.     Data Reviewed: I have personally reviewed following labs and imaging studies  CBC: Recent Labs   Lab 07/05/19 1942 07/09/19 1014 07/09/19 2023 07/10/19 0847  WBC 10.4 12.0* 12.7* 9.3  HGB 15.0 15.3 15.2 14.1  HCT 43.3 42.7 42.8 39.1  MCV 92.1 90.1 89.2 89.9  PLT 189 212 200 106   Basic Metabolic Panel: Recent Labs  Lab 07/05/19 1942 07/09/19 1014 07/09/19 2023 07/10/19 0847  NA 130* 132*  --  130*  K 4.3 3.9  --  3.6  CL 90* 95*  --  96*  CO2 25 21*  --  22  GLUCOSE 139* 139*  --  159*  BUN 9 12  --  16  CREATININE 1.05 1.07 1.12 1.28*  CALCIUM 9.0 9.1  --  8.6*   GFR: Estimated Creatinine Clearance: 43.7 mL/min (A) (by C-G formula based on SCr of 1.28 mg/dL (H)). Liver Function  Tests: Recent Labs  Lab 07/05/19 1942 07/09/19 1014 07/10/19 0847  AST 189* 194* 186*  ALT 107* 101* 87*  ALKPHOS 474* 461* 431*  BILITOT 16.0* 21.8* 19.1*  PROT 7.3 7.2 5.8*  ALBUMIN 2.8* 2.5* 2.0*   Recent Labs  Lab 07/05/19 1942 07/09/19 1014  LIPASE 24 53*   No results for input(s): AMMONIA in the last 168 hours. Coagulation Profile: No results for input(s): INR, PROTIME in the last 168 hours. Cardiac Enzymes: No results for input(s): CKTOTAL, CKMB, CKMBINDEX, TROPONINI in the last 168 hours. BNP (last 3 results) No results for input(s): PROBNP in the last 8760 hours. HbA1C: No results for input(s): HGBA1C in the last 72 hours. CBG: No results for input(s): GLUCAP in the last 168 hours. Lipid Profile: No results for input(s): CHOL, HDL, LDLCALC, TRIG, CHOLHDL, LDLDIRECT in the last 72 hours. Thyroid Function Tests: No results for input(s): TSH, T4TOTAL, FREET4, T3FREE, THYROIDAB in the last 72 hours. Anemia Panel: No results for input(s): VITAMINB12, FOLATE, FERRITIN, TIBC, IRON, RETICCTPCT in the last 72 hours. Sepsis Labs: No results for input(s): PROCALCITON, LATICACIDVEN in the last 168 hours.  Recent Results (from the past 240 hour(s))  SARS Coronavirus 2 by RT PCR (hospital order, performed in [facility] hospital lab) Nasopharyngeal Nasopharyngeal Swab      Status: None   Collection Time: 07/09/19  5:53 PM   Specimen: Nasopharyngeal Swab  Result Value Ref Range Status   SARS Coronavirus 2 NEGATIVE NEGATIVE Final    Comment: (NOTE) SARS-CoV-2 target nucleic acids are NOT DETECTED. The SARS-CoV-2 RNA is generally detectable in upper and lower respiratory specimens during the acute phase of infection. The lowest concentration of SARS-CoV-2 viral copies this assay can detect is 250 copies / mL. A negative result does not preclude SARS-CoV-2 infection and should not be used as the sole basis for treatment or other patient management decisions.  A negative result may occur with improper specimen collection / handling, submission of specimen other than nasopharyngeal swab, presence of viral mutation(s) within the areas targeted by this assay, and inadequate number of viral copies (<250 copies / mL). A negative result must be combined with clinical observations, patient history, and epidemiological information. Fact Sheet for Patients:   StrictlyIdeas.no Fact Sheet for Healthcare Providers: BankingDealers.co.za This test is not yet approved or cleared  by the [place] FDA and has been authorized for detection and/or diagnosis of SARS-CoV-2 by FDA under an Emergency Use Authorization (EUA).  This EUA will remain in effect (meaning this test can be used) for the duration of the COVID-19 declaration under Section 564(b)(1) of the Act, 21 U.S.C. section 360bbb-3(b)(1), unless the authorization is terminated or revoked sooner. Performed at [facility], [address] [address], [place], [place] [address]          Radiology Studies: CT ABDOMEN PELVIS WO CONTRAST  Result Date: 07/09/2019 CLINICAL DATA:  Abdominal distension, recent diagnosis of liver cancer EXAM: CT ABDOMEN AND PELVIS WITHOUT CONTRAST TECHNIQUE: Multidetector CT imaging of the abdomen and pelvis was performed following the  standard protocol without IV contrast. COMPARISON:  MRI 06/29/2019, CT 06/28/2019 FINDINGS: Lower chest: Dependent atelectatic changes seen in the lung bases. No visible effusion or consolidation. Normal cardiac size. Postsurgical changes from prior CABG with abandoned epicardial pacer wires and surgical clips in the mediastinum with calcification of the native coronary arteries. Hepatobiliary: Redemonstration of the innumerable hypoattenuating lesions throughout both lobes of the liver. Overall disease burden is grossly similar to comparison  study in the absence of contrast media. New small volume perihepatic ascites is noted. Gallbladder is decompressed. Mild wall thickening is nonspecific given underdistention. No visible calcified gallstones or biliary ductal dilatation. Pancreas: Redemonstration of a 3.5 cm hypoattenuating lesion at the tail of the pancreas. Remaining portions of the pancreas are unremarkable without peripancreatic inflammation or ductal dilatation. Spleen: Normal in size without focal abnormality. Small volume perisplenic ascites. Adrenals/Urinary Tract: Normal adrenal glands. Mild symmetric bilateral perinephric stranding is similar to prior. No suspicious renal lesion, urolithiasis or hydronephrosis. Bladder is decompressed. Mild wall thickening likely related to underdistention. Stomach/Bowel: Distal esophagus, stomach are unremarkable. Mild thickening of the duodenum and small bowel possibly related to the presence of edematous fluid in the upper abdomen. No significant small bowel dilatation or evidence of mechanical obstruction. The appendix is surgically absent. No colonic dilatation or wall thickening. Vascular/Lymphatic: Atherosclerotic calcifications throughout the abdominal aorta and branch vessels. No aneurysm or ectasia. No enlarged abdominopelvic lymph nodes. Reproductive: Multiple prostate brachytherapy implants are noted. Seminal vesicles are unremarkable. Other: Small volume  ascites in the upper abdomen, as detailed above. Additional small volume simple attenuation fluid in the low abdomen and pelvis. No abdominopelvic free air. Postsurgical changes in the right groin may reflect prior vascular access or inguinal hernia repair. Mild body wall edema. Musculoskeletal: The osseous structures appear diffusely demineralized which may limit detection of small or nondisplaced fractures. Multilevel degenerative changes are present in the imaged portions of the spine. No acute osseous abnormality or suspicious osseous lesion. IMPRESSION: 1. Redemonstration of the innumerable hypoattenuating lesions throughout both lobes of the liver, consistent with metastatic disease. Overall disease burden is grossly similar to comparison study. 2. Interval development of small volume ascites throughout the abdomen and pelvis may reflect hepatic dysfunction secondary to the metastatic burden above. 3. Redemonstration of a 3.5 cm hypoattenuating lesion at the tail of the pancreas, also suspicious for primary malignancy or metastasis though patient's recent liver biopsy results demonstrated a urothelial origin. 4. Mild thickening of the duodenum and small bowel possibly related to the presence of edematous fluid in the upper abdomen. Correlate for upper GI symptoms. 5. Mild body wall edema. 6. Aortic Atherosclerosis (ICD10-I70.0). Electronically Signed   By: Lovena Le M.D.   On: 07/09/2019 20:14   [place] ASCITES (ABDOMEN LIMITED)  Result Date: 07/10/2019 CLINICAL DATA:  Ascites. EXAM: LIMITED ABDOMEN ULTRASOUND FOR ASCITES TECHNIQUE: Limited ultrasound survey for ascites was performed in all four abdominal quadrants. COMPARISON:  None. FINDINGS: Trace ascites within the RIGHT lower quadrant and LEFT lower quadrant. Amount of ascites is insufficient for paracentesis. IMPRESSION: Trace ascites. Electronically Signed   By: Franki Cabot M.D.   On: 07/10/2019 11:04        Scheduled Meds: . bisacodyl  5 mg  Oral QHS  . enoxaparin (LOVENOX) injection  40 mg Subcutaneous Q24H  . polyethylene glycol powder  1 Container Oral Daily   Continuous Infusions:   Time spent: 30 minutes with over 50% of the time coordinating the patient's care    Harold Hedge, DO [facility] Pager [phone]  Call night coverage person covering after 7pm

## 2019-07-10 NOTE — Progress Notes (Signed)
Interventional Radiology Brief Note:  Patient brought to IR today for possible paracentesis.  Very small, distributed fluid not amenable to paracentesis.   Images saved.  No procedure performed.  Patient returned to unit.   Brynda Greathouse, MS RD PA-C

## 2019-07-10 NOTE — Progress Notes (Signed)
[facility])  Pt friend [name] called [facility] to refer Mr. Rohn for hospice services.  They had been waiting for the [place] to arrange on his behalf but pt was admitted due to pain prior to the [place] arranging hospice.  Per [name], she is his [facility] and contact.  He has a son that is from out of town.  Plan will be to d/c tomorrow, if medically cleared to do so.  He will d/c to his home and then transition to [place] home after that.  [place] will meet [name] and pt at the bedside in am to discuss hospice further.  Venia Carbon RN, BSN, [facility] Liaison

## 2019-07-10 NOTE — ED Notes (Signed)
Breakfast Ordered 

## 2019-07-11 DIAGNOSIS — K5903 Drug induced constipation: Principal | ICD-10-CM

## 2019-07-11 DIAGNOSIS — Z515 Encounter for palliative care: Secondary | ICD-10-CM

## 2019-07-11 DIAGNOSIS — Z66 Do not resuscitate: Secondary | ICD-10-CM

## 2019-07-11 DIAGNOSIS — Z7189 Other specified counseling: Secondary | ICD-10-CM

## 2019-07-11 LAB — COMPREHENSIVE METABOLIC PANEL
ALT: 94 U/L — ABNORMAL HIGH (ref 0–44)
AST: 216 U/L — ABNORMAL HIGH (ref 15–41)
Albumin: 2.1 g/dL — ABNORMAL LOW (ref 3.5–5.0)
Alkaline Phosphatase: 482 U/L — ABNORMAL HIGH (ref 38–126)
Anion gap: 15 (ref 5–15)
BUN: 15 mg/dL (ref 8–23)
CO2: 21 mmol/L — ABNORMAL LOW (ref 22–32)
Calcium: 8.7 mg/dL — ABNORMAL LOW (ref 8.9–10.3)
Chloride: 96 mmol/L — ABNORMAL LOW (ref 98–111)
Creatinine, Ser: 1.14 mg/dL (ref 0.61–1.24)
GFR calc Af Amer: >60 mL/min (ref >60)
GFR calc non Af Amer: 59 mL/min — ABNORMAL LOW (ref >60)
Glucose, Bld: 100 mg/dL — ABNORMAL HIGH (ref 70–99)
Potassium: 4.1 mmol/L (ref 3.5–5.1)
Sodium: 132 mmol/L — ABNORMAL LOW (ref 135–145)
Total Bilirubin: 20.7 mg/dL (ref 0.3–1.2)
Total Protein: 5.9 g/dL — ABNORMAL LOW (ref 6.5–8.1)

## 2019-07-11 LAB — CBC
HCT: 39.7 % (ref 39.0–52.0)
Hemoglobin: 14.5 g/dL (ref 13.0–17.0)
MCH: 31.5 pg (ref 26.0–34.0)
MCHC: 36.5 g/dL — ABNORMAL HIGH (ref 30.0–36.0)
MCV: 86.3 fL (ref 80.0–100.0)
Platelets: 188 10*3/uL (ref 150–400)
RBC: 4.6 MIL/uL (ref 4.22–5.81)
RDW: 18.4 % — ABNORMAL HIGH (ref 11.5–15.5)
WBC: 10.7 10*3/uL — ABNORMAL HIGH (ref 4.0–10.5)
nRBC: 0 % (ref 0.0–0.2)

## 2019-07-11 MED ORDER — FLEET ENEMA 7-19 GM/118ML RE ENEM
1.0000 | ENEMA | Freq: Once | RECTAL | Status: AC
  Administered 2019-07-11: 1 via RECTAL
  Filled 2019-07-11: qty 1

## 2019-07-11 MED ORDER — SENNOSIDES-DOCUSATE SODIUM 8.6-50 MG PO TABS
2.0000 | ORAL_TABLET | Freq: Two times a day (BID) | ORAL | Status: DC
  Administered 2019-07-11 – 2019-07-12 (×2): 2 via ORAL
  Filled 2019-07-11 (×2): qty 2

## 2019-07-11 MED ORDER — BISACODYL 5 MG PO TBEC: 5.0000 mg | DELAYED_RELEASE_TABLET | Freq: Every day | ORAL | Status: DC | PRN

## 2019-07-11 MED ORDER — BISACODYL 10 MG RE SUPP: 10.0000 mg | Freq: Every day | RECTAL | Status: DC | PRN

## 2019-07-11 MED ORDER — FUROSEMIDE 10 MG/ML IJ SOLN
40.0000 mg | Freq: Once | INTRAMUSCULAR | Status: AC
  Administered 2019-07-11: 40 mg via INTRAVENOUS
  Filled 2019-07-11: qty 4

## 2019-07-11 MED ORDER — TRAMADOL HCL 50 MG PO TABS
50.0000 mg | ORAL_TABLET | Freq: Four times a day (QID) | ORAL | Status: DC | PRN
  Administered 2019-07-12 (×2): 50 mg via ORAL
  Filled 2019-07-11 (×3): qty 1

## 2019-07-11 MED ORDER — LINACLOTIDE 145 MCG PO CAPS
290.0000 ug | ORAL_CAPSULE | Freq: Every day | ORAL | Status: DC
  Administered 2019-07-11: 290 ug via ORAL
  Filled 2019-07-11: qty 2

## 2019-07-11 NOTE — Consult Note (Addendum)
Palliative Medicine Inpatient Consult Note  Reason for consult:  Goals of care conversations "Abdominal pain and swelling in setting of recently diagnosed metastatic urothelial cancer to liver and pancreas"  HPI:  Per intake H&P --> This isa very pleasant42 year old male with history of recently diagnosed metastatic cancer with unknown primary (suspected pancreatic) with mets to liver and pancreas during recent hospitalization from 5/3-5/7, prostate cancer with radioactive seed implant, CAD s/p CABG, trigeminal neuralgia who reported constipation post discharge at which time he called [facility] and was told to take MiraLAX and was evaluated in the ED on 5/11 for opiate-induced constipation and evaluated by GI at that time without any further recommendations and discharged from the ED who has been having increasing abdominal distention and pain over the past several days with jaundice prompting him to return to the ED on 5/14.Also admitted to new onset lower extremity edema over the past week. Unfortunately he is not been able to establish care with oncology since his recent discharge.  Palliative care was asked to meet with patient to discuss goals and prognosis.   Clinical Assessment/Goals of Care: I have reviewed medical records including EPIC notes, labs and imaging, received report from bedside RN, assessed the patient.    I met with Thomas Riddle to further discuss diagnosis prognosis, GOC, EOL wishes, disposition and options.   I introduced Palliative Medicine as specialized medical care for people living with serious illness. It focuses on providing relief from the symptoms and stress of a serious illness. The goal is to improve quality of life for both the patient and the family.  I asked Thomas Riddle to tell me about himself. He shares that he is from [place] in the [facility] originally. His "ticket out of there" was him signing up for the [employer] at the age of 9. 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 He later went on to work at the [employer] in [place]. 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 He has been married twice his first wife passed away in 64 with whom he shares a son. His second wife and he separated. He now has a "lady friend", [name] who he has been with for the past four years. He moved to [place], [place]. in 1970 to provide stability for his family. 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 He enjoys spending time with his girlfriend. 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   In regards to bADLs he had been able to do everything for himself. He lives in a townhouse at [employer] at the [employer]   I asked Thomas Riddle to tell me about his health. He states that things had been going along well until his diagnosis of trigeminal neuralgia. He shares that this was an ongoing diagnosis over the past two years. He had a tooth extracted that was thought to be the culprit of this pain which did not remediate the problem. He noticed on April fools day of this year that he was having increasing abdominal pain which prompted workup. It was later established that he had metastatic pancreatic (likely) cancer.   In terms of symptoms its seems the most troubling symptom for Thomas Riddle has been his ongoing constipation his personal goals include moving his bowel more regularly. He  shares that he does have abdominal pain intermittently which is relieved with the tramadol he is presently on. He states he would like to be able to take morphine or oxycodone but has fear that he will wind up constipated. He discussed how we need to balance the medications in terms of treating pain and constipation. We talked about the regiment he was on for constipation and how we can  help this moving forward.   A detailed discussion was had today regarding advanced directives, patient makes decisions for himself though is he were unable to he would rely on his girlfriend, Freddie Breech.    Concepts specific to code status, artifical feeding and hydration, continued IV antibiotics and rehospitalization was had. Patient is DNAR/DNI.   The difference between a aggressive medical intervention path  and a palliative comfort care path for this patient at this time was had. Patient states that he is going home with [facility] hospice possibly as early as today.  Discussed the importance of continued conversation with family and their  medical providers regarding overall plan of care and treatment options, ensuring decisions are within the context of the patients values and GOCs.  Decision Maker: Patient can make decisions for himself.   SUMMARY OF RECOMMENDATIONS   DNAR/DNI  Plan for transition home with hospice  Constipation management  Code Status/Advance Care Planning: DNAR/DNI   Symptom Management:  Constipation:  - Need aggressive regiment,   - senna-s 2 tabs PO BID   - Miralax QHS   - Bisacodyl PR Qday PRN  - Enema QDay PRN  - See what is covered on hospice formulary (linzess/ amitza/movantik)   Palliative Prophylaxis:   Constipation, Pain, Turn Q2H, Delirium precuations  Additional Recommendations (Limitations, Scope, Preferences):  Comfort oriented scope to provide symptom relief   Psycho-social/Spiritual:   Desire for further Chaplaincy support: Yes  Additional Recommendations: Hospice educations   Prognosis: Poor, likely < 10 weeks  Discharge Planning: Home with home hospice through [facility].  PPS: 50%    This conversation/these recommendations were discussed with patient primary care team, Dr. Neysa Bonito  Time In: 0700 Time Out: 0830 Total Time: 90 minutes Greater than 50%  of this time was spent counseling and coordinating care  related to the above assessment and plan.  [facility] Team Team Cell Phone: [phone] Please utilize secure chat with additional questions, if there is no response within 30 minutes please call the above phone number  Palliative Medicine Team providers are available by phone from 7am to 7pm daily and can be reached through the team cell phone.  Should this patient require assistance outside of these hours, please call the patient's attending physician.

## 2019-07-11 NOTE — Plan of Care (Signed)
  Problem: Clinical Measurements: Goal: Respiratory complications will improve Outcome: Adequate for Discharge Goal: Cardiovascular complication will be avoided Outcome: Adequate for Discharge   Problem: Activity: Goal: Risk for activity intolerance will decrease Outcome: Adequate for Discharge   Problem: Nutrition: Goal: Adequate nutrition will be maintained Outcome: Adequate for Discharge   Problem: Coping: Goal: Level of anxiety will decrease Outcome: Adequate for Discharge   Problem: Pain Managment: Goal: General experience of comfort will improve Outcome: Adequate for Discharge

## 2019-07-11 NOTE — Progress Notes (Signed)
PROGRESS NOTE    Thomas Riddle    Code Status: DNR  ZOX:[id number] DOB: 31-Mar-1934 DOA: 07/09/2019 LOS: 2 days  PCP: System, Pcp Not In CC:  Chief Complaint  Patient presents with   Abdominal Swelling       Hospital Summary   This is a very pleasant 85 year old male with history of recently diagnosed metastatic cancer with unknown primary (suspected pancreatic) with mets to liver and pancreas during recent hospitalization from 5/3-5/7, prostate cancer with radioactive seed implant, CAD s/p CABG, trigeminal neuralgia who reported constipation post discharge at which time he called [facility] and was told to take MiraLAX and was evaluated in the ED on 5/11 for opiate-induced constipation and evaluated by GI at that time without any further recommendations and discharged from the ED who has been having increasing abdominal distention and pain over the past several days with jaundice prompting him to return to the ED on 5/14.  Also admitted to new onset lower extremity edema over the past week.  Unfortunately he is not been able to establish care with oncology since his recent discharge.  ED course: Elevated T bili 21.8 (13 previously), transaminitis with alk phos 461, AST 194, ALT 101.  Dr. Loletha Carrow, GI, was notified by ED who reported that there was unfortunately no discrete focus of obstruction amenable to intervention and did not have any further recommendations other than oncology eval and pain control.  Given 1 L NS bolus and Toradol 30 mg x 1 and admitted to [facility] service with oncology and palliative care consult.  CT scan of the abdomen showed small amount of ascites which was not amenable to ultrasound-guided paracentesis.     A & P   Principal Problem:   Abdominal pain Active Problems:   Hyperbilirubinemia   Constipation   Transaminitis   Metastatic cancer (HCC)   Goals of care, counseling/discussion   Palliative care by specialist   DNR (do not resuscitate)     Abdominal pain and  swelling, suspect multifactorial: Cancer related pain, opiate induced constipation, abdominal distention  1. Recent RUQ ultrasound on 5/11 with nonspecific mild circumferential gallbladder wall thickening and gallbladder wall polyp as well as diffuse hepatic metastatic disease 2. GI notified by ED, unfortunately no intervention or recommendations from their standpoint 3. CT abdomen pelvis: Redemonstration of metastatic disease as well as interval development of small volume ascites 4. Amount of ascites is insufficient for US guided paracentesis 5. Unfortunately limited options for pain management given liver failure and AKI.   6. Seems a bit confused today after his morphine, will hold morphine for now and continue with tramadol 7. Plan is for home with hospice when medically stable, currently has not had any bowel movement since Friday with increased abdominal pain and increasing volume overload which will keep him here until additional night until he is on an appropriate regimen to be discharged on  Recently diagnosed metastatic poorly differentiated carcinoma possibly pancreatic primary 1. CA19-9: 10,000, CEA 62 2. Evaluated by oncology today: Limited chemotherapeutic options and recommending hospice 3. Home with hospice when medically stable  Liver failure secondary to metastatic disease with jaundice and volume overload 1. No intervention warranted per GI, as above 2. CMP worse today 3. Lasix IV x1 4. Holding tylenol for now  Lower extremity edema secondary to hepatic failure 1. Compression stockings and Lasix  Hyponatremia, suspect secondary to liver failure 1. As above  AKI 1. Improving after holding NSAIDs  Reactive leukocytosis   History of  CAD s/p CABG Not on any home medications for this   DVT prophylaxis: Lovenox Family Communication: Patient's significant other at bedside has been updated yesterday Disposition Plan:  Status is: Inpatient  Remains inpatient  appropriate because:Ongoing active pain requiring inpatient pain management and Volume control and bowel support   Dispo: The patient is from: Home              Anticipated d/c is to: Home              Anticipated d/c date is: 1 day              Patient currently is not medically stable to d/c.           Pressure injury documentation    None  Consultants  GI Oncology Palliative care   Procedures  None  Antibiotics   Anti-infectives (From admission, onward)   None        Subjective   Reports he has not had a BM since Friday with increasing abdominal distention and pain.  Also with some shortness of breath now.  No nausea or vomiting or any other complaints.  No issues overnight. Objective   Vitals:   07/10/19 0700 07/10/19 0804 07/10/19 1056 07/11/19 0534  BP: (!) 149/77 135/70 135/72 137/74  Pulse: 91 96 93 96  Resp: 16 (!) 21 18 18   Temp:  (!) 97.4 F (36.3 C) 97.6 F (36.4 C) 97.6 F (36.4 C)  TempSrc:  Axillary Oral Oral  SpO2: 99% 97% 98% 95%  Weight:      Height:        Intake/Output Summary (Last 24 hours) at 07/11/2019 1239 Last data filed at 07/11/2019 0900 Gross per 24 hour  Intake 420 ml  Output --  Net 420 ml   Filed Weights   07/09/19 1008  Weight: 75 kg    Examination:  Physical Exam Vitals and nursing note reviewed.  Constitutional:      General: He is not in acute distress. Eyes:     General: Scleral icterus present.  Cardiovascular:     Rate and Rhythm: Normal rate and regular rhythm.  Pulmonary:     Effort: Pulmonary effort is normal. No respiratory distress.     Breath sounds: Rales present.  Abdominal:     General: There is distension.     Tenderness: There is no abdominal tenderness.  Musculoskeletal:     Right lower leg: Edema present.     Left lower leg: Edema present.  Skin:    Coloration: Skin is jaundiced.  Neurological:     Mental Status: He is oriented to person, place, and time.     Comments:  Oriented but slowed mentation after morphine  Psychiatric:        Behavior: Behavior normal.        Thought Content: Thought content normal.     Data Reviewed: I have personally reviewed following labs and imaging studies  CBC: Recent Labs  Lab 07/05/19 1942 07/09/19 1014 07/09/19 2023 07/10/19 0847 07/11/19 0353  WBC 10.4 12.0* 12.7* 9.3 10.7*  HGB 15.0 15.3 15.2 14.1 14.5  HCT 43.3 42.7 42.8 39.1 39.7  MCV 92.1 90.1 89.2 89.9 86.3  PLT 189 212 200 158 888   Basic Metabolic Panel: Recent Labs  Lab 07/05/19 1942 07/09/19 1014 07/09/19 2023 07/10/19 0847 07/11/19 0353  NA 130* 132*  --  130* 132*  K 4.3 3.9  --  3.6 4.1  CL 90* 95*  --  96* 96*  CO2 25 21*  --  22 21*  GLUCOSE 139* 139*  --  159* 100*  BUN 9 12  --  16 15  CREATININE 1.05 1.07 1.12 1.28* 1.14  CALCIUM 9.0 9.1  --  8.6* 8.7*   GFR: Estimated Creatinine Clearance: 49.1 mL/min (by C-G formula based on SCr of 1.14 mg/dL). Liver Function Tests: Recent Labs  Lab 07/05/19 1942 07/09/19 1014 07/10/19 0847 07/11/19 0353  AST 189* 194* 186* 216*  ALT 107* 101* 87* 94*  ALKPHOS 474* 461* 431* 482*  BILITOT 16.0* 21.8* 19.1* 20.7*  PROT 7.3 7.2 5.8* 5.9*  ALBUMIN 2.8* 2.5* 2.0* 2.1*   Recent Labs  Lab 07/05/19 1942 07/09/19 1014  LIPASE 24 53*   No results for input(s): AMMONIA in the last 168 hours. Coagulation Profile: No results for input(s): INR, PROTIME in the last 168 hours. Cardiac Enzymes: No results for input(s): CKTOTAL, CKMB, CKMBINDEX, TROPONINI in the last 168 hours. BNP (last 3 results) No results for input(s): PROBNP in the last 8760 hours. HbA1C: No results for input(s): HGBA1C in the last 72 hours. CBG: No results for input(s): GLUCAP in the last 168 hours. Lipid Profile: No results for input(s): CHOL, HDL, LDLCALC, TRIG, CHOLHDL, LDLDIRECT in the last 72 hours. Thyroid Function Tests: No results for input(s): TSH, T4TOTAL, FREET4, T3FREE, THYROIDAB in the last 72  hours. Anemia Panel: No results for input(s): VITAMINB12, FOLATE, FERRITIN, TIBC, IRON, RETICCTPCT in the last 72 hours. Sepsis Labs: No results for input(s): PROCALCITON, LATICACIDVEN in the last 168 hours.  Recent Results (from the past 240 hour(s))  SARS Coronavirus 2 by RT PCR (hospital order, performed in [facility] hospital lab) Nasopharyngeal Nasopharyngeal Swab     Status: None   Collection Time: 07/09/19  5:53 PM   Specimen: Nasopharyngeal Swab  Result Value Ref Range Status   SARS Coronavirus 2 NEGATIVE NEGATIVE Final    Comment: (NOTE) SARS-CoV-2 target nucleic acids are NOT DETECTED. The SARS-CoV-2 RNA is generally detectable in upper and lower respiratory specimens during the acute phase of infection. The lowest concentration of SARS-CoV-2 viral copies this assay can detect is 250 copies / mL. A negative result does not preclude SARS-CoV-2 infection and should not be used as the sole basis for treatment or other patient management decisions.  A negative result may occur with improper specimen collection / handling, submission of specimen other than nasopharyngeal swab, presence of viral mutation(s) within the areas targeted by this assay, and inadequate number of viral copies (<250 copies / mL). A negative result must be combined with clinical observations, patient history, and epidemiological information. Fact Sheet for Patients:   StrictlyIdeas.no Fact Sheet for Healthcare Providers: BankingDealers.co.za This test is not yet approved or cleared  by the [place] FDA and has been authorized for detection and/or diagnosis of SARS-CoV-2 by FDA under an Emergency Use Authorization (EUA).  This EUA will remain in effect (meaning this test can be used) for the duration of the COVID-19 declaration under Section 564(b)(1) of the Act, 21 U.S.C. section 360bbb-3(b)(1), unless the authorization is terminated or revoked  sooner. Performed at [facility], [place] [address] [place], [place] [address]          Radiology Studies: CT ABDOMEN PELVIS WO CONTRAST  Result Date: 07/09/2019 CLINICAL DATA:  Abdominal distension, recent diagnosis of liver cancer EXAM: CT ABDOMEN AND PELVIS WITHOUT CONTRAST TECHNIQUE: Multidetector CT imaging of the abdomen and pelvis was performed following the standard protocol  without IV contrast. COMPARISON:  MRI 06/29/2019, CT 06/28/2019 FINDINGS: Lower chest: Dependent atelectatic changes seen in the lung bases. No visible effusion or consolidation. Normal cardiac size. Postsurgical changes from prior CABG with abandoned epicardial pacer wires and surgical clips in the mediastinum with calcification of the native coronary arteries. Hepatobiliary: Redemonstration of the innumerable hypoattenuating lesions throughout both lobes of the liver. Overall disease burden is grossly similar to comparison study in the absence of contrast media. New small volume perihepatic ascites is noted. Gallbladder is decompressed. Mild wall thickening is nonspecific given underdistention. No visible calcified gallstones or biliary ductal dilatation. Pancreas: Redemonstration of a 3.5 cm hypoattenuating lesion at the tail of the pancreas. Remaining portions of the pancreas are unremarkable without peripancreatic inflammation or ductal dilatation. Spleen: Normal in size without focal abnormality. Small volume perisplenic ascites. Adrenals/Urinary Tract: Normal adrenal glands. Mild symmetric bilateral perinephric stranding is similar to prior. No suspicious renal lesion, urolithiasis or hydronephrosis. Bladder is decompressed. Mild wall thickening likely related to underdistention. Stomach/Bowel: Distal esophagus, stomach are unremarkable. Mild thickening of the duodenum and small bowel possibly related to the presence of edematous fluid in the upper abdomen. No significant small bowel dilatation or evidence of  mechanical obstruction. The appendix is surgically absent. No colonic dilatation or wall thickening. Vascular/Lymphatic: Atherosclerotic calcifications throughout the abdominal aorta and branch vessels. No aneurysm or ectasia. No enlarged abdominopelvic lymph nodes. Reproductive: Multiple prostate brachytherapy implants are noted. Seminal vesicles are unremarkable. Other: Small volume ascites in the upper abdomen, as detailed above. Additional small volume simple attenuation fluid in the low abdomen and pelvis. No abdominopelvic free air. Postsurgical changes in the right groin may reflect prior vascular access or inguinal hernia repair. Mild body wall edema. Musculoskeletal: The osseous structures appear diffusely demineralized which may limit detection of small or nondisplaced fractures. Multilevel degenerative changes are present in the imaged portions of the spine. No acute osseous abnormality or suspicious osseous lesion. IMPRESSION: 1. Redemonstration of the innumerable hypoattenuating lesions throughout both lobes of the liver, consistent with metastatic disease. Overall disease burden is grossly similar to comparison study. 2. Interval development of small volume ascites throughout the abdomen and pelvis may reflect hepatic dysfunction secondary to the metastatic burden above. 3. Redemonstration of a 3.5 cm hypoattenuating lesion at the tail of the pancreas, also suspicious for primary malignancy or metastasis though patient's recent liver biopsy results demonstrated a urothelial origin. 4. Mild thickening of the duodenum and small bowel possibly related to the presence of edematous fluid in the upper abdomen. Correlate for upper GI symptoms. 5. Mild body wall edema. 6. Aortic Atherosclerosis (ICD10-I70.0). Electronically Signed   By: Lovena Le M.D.   On: 07/09/2019 20:14   [place] ASCITES (ABDOMEN LIMITED)  Result Date: 07/10/2019 CLINICAL DATA:  Ascites. EXAM: LIMITED ABDOMEN ULTRASOUND FOR ASCITES  TECHNIQUE: Limited ultrasound survey for ascites was performed in all four abdominal quadrants. COMPARISON:  None. FINDINGS: Trace ascites within the RIGHT lower quadrant and LEFT lower quadrant. Amount of ascites is insufficient for paracentesis. IMPRESSION: Trace ascites. Electronically Signed   By: Franki Cabot M.D.   On: 07/10/2019 11:04        Scheduled Meds:  enoxaparin (LOVENOX) injection  40 mg Subcutaneous Q24H   polyethylene glycol powder  1 Container Oral Daily   senna-docusate  2 tablet Oral BID   Continuous Infusions:   Time spent: 33 minutes with over 50% of the time coordinating the patient's care    Harold Hedge, DO [facility]  Pager [phone]  Call night coverage person covering after 7pm

## 2019-07-11 NOTE — Progress Notes (Signed)
[facility] ([facility])  Met with patient and his significant other [name] at the bedside to further discuss hospice.  Questions answered and support provided.  Plan is to dc home POV once pt bowels have moved.  Pt will dc to his current address with plans to transition to [name] home once he can no longer care for himself.  Once pt is ready to dc, please arrange for any comfort scripts that the pt may need prior to hospice services starting at home so there is no lapse in his comfort.  Thank you, Venia Carbon RN, BSN, [facility] Liaison

## 2019-07-12 ENCOUNTER — Telehealth: Payer: Self-pay | Admitting: *Deleted

## 2019-07-12 DIAGNOSIS — R109 Unspecified abdominal pain: Secondary | ICD-10-CM

## 2019-07-12 LAB — CBC
HCT: 36.8 % — ABNORMAL LOW (ref 39.0–52.0)
Hemoglobin: 13.3 g/dL (ref 13.0–17.0)
MCH: 31.7 pg (ref 26.0–34.0)
MCHC: 36.1 g/dL — ABNORMAL HIGH (ref 30.0–36.0)
MCV: 87.6 fL (ref 80.0–100.0)
Platelets: 165 10*3/uL (ref 150–400)
RBC: 4.2 MIL/uL — ABNORMAL LOW (ref 4.22–5.81)
RDW: 19.2 % — ABNORMAL HIGH (ref 11.5–15.5)
WBC: 9.4 10*3/uL (ref 4.0–10.5)
nRBC: 0 % (ref 0.0–0.2)

## 2019-07-12 LAB — COMPREHENSIVE METABOLIC PANEL
ALT: 98 U/L — ABNORMAL HIGH (ref 0–44)
AST: 237 U/L — ABNORMAL HIGH (ref 15–41)
Albumin: 1.9 g/dL — ABNORMAL LOW (ref 3.5–5.0)
Alkaline Phosphatase: 443 U/L — ABNORMAL HIGH (ref 38–126)
Anion gap: 12 (ref 5–15)
BUN: 19 mg/dL (ref 8–23)
CO2: 23 mmol/L (ref 22–32)
Calcium: 8.5 mg/dL — ABNORMAL LOW (ref 8.9–10.3)
Chloride: 95 mmol/L — ABNORMAL LOW (ref 98–111)
Creatinine, Ser: 1.22 mg/dL (ref 0.61–1.24)
GFR calc Af Amer: >60 mL/min (ref >60)
GFR calc non Af Amer: 54 mL/min — ABNORMAL LOW (ref >60)
Glucose, Bld: 90 mg/dL (ref 70–99)
Potassium: 3.9 mmol/L (ref 3.5–5.1)
Sodium: 130 mmol/L — ABNORMAL LOW (ref 135–145)
Total Bilirubin: 19.8 mg/dL (ref 0.3–1.2)
Total Protein: 5.4 g/dL — ABNORMAL LOW (ref 6.5–8.1)

## 2019-07-12 MED ORDER — TRAMADOL HCL 50 MG PO TABS: 50.0000 mg | ORAL_TABLET | Freq: Four times a day (QID) | ORAL | 0 refills | Status: AC | PRN

## 2019-07-12 MED ORDER — OXYCODONE HCL 5 MG PO TABS: 5.0000 mg | ORAL_TABLET | ORAL | 0 refills | Status: AC | PRN

## 2019-07-12 MED ORDER — LINACLOTIDE 290 MCG PO CAPS: 290.0000 ug | ORAL_CAPSULE | Freq: Every day | ORAL | 1 refills | Status: AC

## 2019-07-12 MED ORDER — POLYETHYLENE GLYCOL 3350 17 G PO PACK: 17.0000 g | PACK | Freq: Every day | ORAL | 1 refills | Status: AC

## 2019-07-12 MED ORDER — BISACODYL 10 MG RE SUPP: 10.0000 mg | Freq: Every day | RECTAL | 0 refills | Status: AC | PRN

## 2019-07-12 MED ORDER — SENNOSIDES-DOCUSATE SODIUM 8.6-50 MG PO TABS: 1.0000 | ORAL_TABLET | Freq: Two times a day (BID) | ORAL | 0 refills | Status: AC | PRN

## 2019-07-12 NOTE — Social Work (Addendum)
Pt plan for d/c to his home address per arrangements made between Palliative Medicine Team and [facility]. Pt son to pick pt up today. Referral confirmed with Farrel Gordon, RN liaison with [facility]. [facility] will contact pt and are planning on coming to see pt this afternoon at his home at 3:30pm. Pt RN Mendel Ryder aware as well.   Westley Hummer, MSW, [place] [employer]

## 2019-07-12 NOTE — Telephone Encounter (Signed)
[facility] RN called with request for Dr. Maylon Peppers to be pt hospice attending upon discharge. Discussed with Delsa Sale, unfortunately MD is moving and will not be able to be pt attending. No further concerns.

## 2019-07-12 NOTE — Progress Notes (Signed)
Patient discharged to home. Patient and family verbalize understanding of all discharge instructions including discharge medications. Patient will be meeting with hospice RN at his home at 3:30pm today. Patient aware.

## 2019-07-12 NOTE — Discharge Instructions (Signed)
Bowel Regimen:  1. Miralax 17 g packet daily with Gatorade plus Linzess 290 mcg capsule daily 2. Add on Senna-Docusate (Senokot-S) twice daily as needed for mild-moderate constipation 3. Add on Dulcolax 10 mg Suppository as needed for severe constipation  Pain regimen: 1. Stop taking Tylenol and Ibuprofen/Advil/Motrin/Alleve/Naproxen, etc. for pain 2. You can take Tramadol 50 mg every 6 hours as needed for pain 3. Oxycodone 5 mg every 4 hours as needed for severe pain  Follow up with [facility] for any future needs

## 2019-07-12 NOTE — Discharge Summary (Addendum)
Physician Discharge Summary  Thomas Riddle [id number] DOB: 04/22/1934   PCP: System, Pcp Not In  Admit date: 07/09/2019 Discharge date: 07/12/2019 Length of Stay: 3 days   Code Status: DNR  Admitted From:  Home Discharged to:   Home with [place]:  None  Equipment/Devices:  None Discharge Condition: Poor prognosis  Recommendations for Outpatient Follow-up   Ensure patient's pain and constipation is well managed Discharged on aggressive constipation regimen, monitor for dehydration Monitor for altered mental status on opiates Holding off on Tylenol given his liver failure Compression stockings for lower extremity edema  Hospital Summary  This is a very pleasant 85 year old male with history of recently diagnosed metastatic cancer with unknown primary (suspected pancreatic) with mets to liver and pancreas during recent hospitalization from 5/3-5/7, prostate cancer with radioactive seed implant, CAD s/p CABG, trigeminal neuralgia who reported constipation post discharge at which time he called [facility] and was told to take MiraLAX and was evaluated in the ED on 5/11 for opiate-induced constipation and evaluated by GI at that time without any further recommendations and discharged from the ED who has been having increasing abdominal distention and pain over the past several days with jaundice prompting him to return to the ED on 5/14.  Also admitted to new onset lower extremity edema over the past week.  Unfortunately he is not been able to establish care with oncology since his recent discharge.   ED course: Elevated T bili 21.8 (13 previously), transaminitis with alk phos 461, AST 194, ALT 101.  Dr. Loletha Carrow, GI, was notified by ED who reported that there was unfortunately no discrete focus of obstruction amenable to intervention and did not have any further recommendations other than oncology eval and pain control.  Given 1 L NS bolus and Toradol 30 mg x 1 and admitted to [facility] service  with oncology and palliative care consult.  CT scan of the abdomen showed small amount of ascites which was not amenable to ultrasound-guided paracentesis.  5/15: Seen by oncology: Limited options, recommend hospice 5/16: Palliative care/hospice consult: Recommended home with hospice 5/17: Pain better controlled and has had a bowel movement after aggressive bowel regimen.  Discharged home with hospice.  Started on aggressive bowel regimen as below      A & P   Principal Problem:   Abdominal pain Active Problems:   Hyperbilirubinemia   Constipation   Transaminitis   Metastatic cancer (HCC)   Goals of care, counseling/discussion   Palliative care by specialist   DNR (do not resuscitate)     Abdominal pain and swelling, suspect multifactorial: Cancer related pain, opiate induced constipation, abdominal distention  Had improved pain and BM Discharged to home with hospice Aggressive bowel regimen, monitor for dehydration on this regimen: Miralax 17 g packet daily with Gatorade plus Linzess 290 mcg capsule daily Add on Senna-Docusate (Senokot-S) twice daily as needed for mild-moderate constipation Add on Dulcolax 10 mg Suppository as needed for severe constipation Pain regimen: Tramadol 50 mg every 6 hours as needed for moderate pain Oxycodone 5 mg every 4 hours as needed for severe pain No Tylenol given liver failure    Recently diagnosed metastatic poorly differentiated carcinoma possibly pancreatic primary CA19-9: 10,000, CEA 62 Evaluated by oncology: Limited chemotherapeutic  Home with hospice with regimen as above   Liver failure secondary to metastatic disease with jaundice and volume overload LFTs continue to worsen With pain and constipation improved, patient is being discharged to home with hospice.  Lower extremity edema secondary to hepatic failure Compression stockings and Lasix   Hyponatremia, suspect secondary to liver failure As above   AKI, possibly from  hepatorenal syndrome and NSAID use improved Improving after holding NSAIDs  Hepatorenal syndrome not confirmed   Reactive leukocytosis    History of CAD s/p CABG Not on any home medications for this    Consultants  GI Oncology Palliative care  Procedures  None  Antibiotics   Anti-infectives (From admission, onward)    None        Subjective   Reports that he had a significant bowel movement yesterday and had improved abdominal distention and pain and tolerated the tramadol.  No chest pain, nausea or vomiting.  No shortness of breath.  Tolerating diet.  No other issues.   Objective   Discharge Exam: Vitals:   07/11/19 2041 07/12/19 0450  BP: 126/73 131/65  Pulse: 87 93  Resp: 19 18  Temp: 97.8 F (36.6 C) 97.8 F (36.6 C)  SpO2: 96% 94%   Vitals:   07/11/19 0534 07/11/19 1434 07/11/19 2041 07/12/19 0450  BP: 137/74 130/65 126/73 131/65  Pulse: 96 80 87 93  Resp: 18 17 19 18   Temp: 97.6 F (36.4 C) 97.6 F (36.4 C) 97.8 F (36.6 C) 97.8 F (36.6 C)  TempSrc: Oral Oral Oral Oral  SpO2: 95% 96% 96% 94%  Weight:      Height:        Physical Exam Vitals and nursing note reviewed. Exam conducted with a chaperone present.  Constitutional:      General: He is not in acute distress. HENT:     Head: Normocephalic.     Mouth/Throat:     Mouth: Mucous membranes are moist.  Eyes:     General: Scleral icterus present.  Cardiovascular:     Rate and Rhythm: Normal rate and regular rhythm.  Pulmonary:     Effort: Pulmonary effort is normal.     Breath sounds: Normal breath sounds.  Abdominal:     General: Bowel sounds are normal.     Tenderness: There is no abdominal tenderness.  Musculoskeletal:     Right lower leg: Edema present.     Left lower leg: Edema present.  Skin:    Coloration: Skin is jaundiced.  Neurological:     General: No focal deficit present.     Mental Status: He is alert.  Psychiatric:        Mood and Affect: Mood normal.         Behavior: Behavior normal.       The results of significant diagnostics from this hospitalization (including imaging, microbiology, ancillary and laboratory) are listed below for reference.     Microbiology: Recent Results (from the past 240 hour(s))  SARS Coronavirus 2 by RT PCR (hospital order, performed in [facility] hospital lab) Nasopharyngeal Nasopharyngeal Swab     Status: None   Collection Time: 07/09/19  5:53 PM   Specimen: Nasopharyngeal Swab  Result Value Ref Range Status   SARS Coronavirus 2 NEGATIVE NEGATIVE Final    Comment: (NOTE) SARS-CoV-2 target nucleic acids are NOT DETECTED. The SARS-CoV-2 RNA is generally detectable in upper and lower respiratory specimens during the acute phase of infection. The lowest concentration of SARS-CoV-2 viral copies this assay can detect is 250 copies / mL. A negative result does not preclude SARS-CoV-2 infection and should not be used as the sole basis for treatment or other patient management decisions.  A negative result  may occur with improper specimen collection / handling, submission of specimen other than nasopharyngeal swab, presence of viral mutation(s) within the areas targeted by this assay, and inadequate number of viral copies (<250 copies / mL). A negative result must be combined with clinical observations, patient history, and epidemiological information. Fact Sheet for Patients:   StrictlyIdeas.no Fact Sheet for Healthcare Providers: BankingDealers.co.za This test is not yet approved or cleared  by the [place] FDA and has been authorized for detection and/or diagnosis of SARS-CoV-2 by FDA under an Emergency Use Authorization (EUA).  This EUA will remain in effect (meaning this test can be used) for the duration of the COVID-19 declaration under Section 564(b)(1) of the Act, 21 U.S.C. section 360bbb-3(b)(1), unless the authorization is terminated or revoked  sooner. Performed at [facility], [place] [address] [place], [place] [address]      Labs: BNP (last 3 results) No results for input(s): BNP in the last 8760 hours. Basic Metabolic Panel: Recent Labs  Lab 07/05/19 1942 07/05/19 1942 07/09/19 1014 07/09/19 2023 07/10/19 0847 07/11/19 0353 07/12/19 0636  NA 130*  --  132*  --  130* 132* 130*  K 4.3  --  3.9  --  3.6 4.1 3.9  CL 90*  --  95*  --  96* 96* 95*  CO2 25  --  21*  --  22 21* 23  GLUCOSE 139*  --  139*  --  159* 100* 90  BUN 9  --  12  --  16 15 19   CREATININE 1.05   < > 1.07 1.12 1.28* 1.14 1.22  CALCIUM 9.0  --  9.1  --  8.6* 8.7* 8.5*   < > = values in this interval not displayed.   Liver Function Tests: Recent Labs  Lab 07/05/19 1942 07/09/19 1014 07/10/19 0847 07/11/19 0353 07/12/19 0636  AST 189* 194* 186* 216* 237*  ALT 107* 101* 87* 94* 98*  ALKPHOS 474* 461* 431* 482* 443*  BILITOT 16.0* 21.8* 19.1* 20.7* 19.8*  PROT 7.3 7.2 5.8* 5.9* 5.4*  ALBUMIN 2.8* 2.5* 2.0* 2.1* 1.9*   Recent Labs  Lab 07/05/19 1942 07/09/19 1014  LIPASE 24 53*   No results for input(s): AMMONIA in the last 168 hours. CBC: Recent Labs  Lab 07/09/19 1014 07/09/19 2023 07/10/19 0847 07/11/19 0353 07/12/19 0636  WBC 12.0* 12.7* 9.3 10.7* 9.4  HGB 15.3 15.2 14.1 14.5 13.3  HCT 42.7 42.8 39.1 39.7 36.8*  MCV 90.1 89.2 89.9 86.3 87.6  PLT 212 200 158 188 165   Cardiac Enzymes: No results for input(s): CKTOTAL, CKMB, CKMBINDEX, TROPONINI in the last 168 hours. BNP: Invalid input(s): POCBNP CBG: No results for input(s): GLUCAP in the last 168 hours. D-Dimer No results for input(s): DDIMER in the last 72 hours. Hgb A1c No results for input(s): HGBA1C in the last 72 hours. Lipid Profile No results for input(s): CHOL, HDL, LDLCALC, TRIG, CHOLHDL, LDLDIRECT in the last 72 hours. Thyroid function studies No results for input(s): TSH, T4TOTAL, T3FREE, THYROIDAB in the last 72 hours.  Invalid input(s):  FREET3 Anemia work up No results for input(s): VITAMINB12, FOLATE, FERRITIN, TIBC, IRON, RETICCTPCT in the last 72 hours. Urinalysis    Component Value Date/Time   COLORURINE AMBER (A) 07/05/2019 1917   APPEARANCEUR CLEAR 07/05/2019 1917   LABSPEC 1.023 07/05/2019 1917   PHURINE 5.0 07/05/2019 1917   GLUCOSEU 50 (A) 07/05/2019 1917   HGBUR SMALL (A) 07/05/2019 1917   BILIRUBINUR MODERATE (  A) 07/05/2019 [place] 07/05/2019 1917   PROTEINUR 30 (A) 07/05/2019 1917   NITRITE NEGATIVE 07/05/2019 1917   LEUKOCYTESUR NEGATIVE 07/05/2019 1917   Sepsis Labs Invalid input(s): PROCALCITONIN,  WBC,  LACTICIDVEN Microbiology Recent Results (from the past 240 hour(s))  SARS Coronavirus 2 by RT PCR (hospital order, performed in [facility] hospital lab) Nasopharyngeal Nasopharyngeal Swab     Status: None   Collection Time: 07/09/19  5:53 PM   Specimen: Nasopharyngeal Swab  Result Value Ref Range Status   SARS Coronavirus 2 NEGATIVE NEGATIVE Final    Comment: (NOTE) SARS-CoV-2 target nucleic acids are NOT DETECTED. The SARS-CoV-2 RNA is generally detectable in upper and lower respiratory specimens during the acute phase of infection. The lowest concentration of SARS-CoV-2 viral copies this assay can detect is 250 copies / mL. A negative result does not preclude SARS-CoV-2 infection and should not be used as the sole basis for treatment or other patient management decisions.  A negative result may occur with improper specimen collection / handling, submission of specimen other than nasopharyngeal swab, presence of viral mutation(s) within the areas targeted by this assay, and inadequate number of viral copies (<250 copies / mL). A negative result must be combined with clinical observations, patient history, and epidemiological information. Fact Sheet for Patients:   StrictlyIdeas.no Fact Sheet for Healthcare  Providers: BankingDealers.co.za This test is not yet approved or cleared  by the [place] FDA and has been authorized for detection and/or diagnosis of SARS-CoV-2 by FDA under an Emergency Use Authorization (EUA).  This EUA will remain in effect (meaning this test can be used) for the duration of the COVID-19 declaration under Section 564(b)(1) of the Act, 21 U.S.C. section 360bbb-3(b)(1), unless the authorization is terminated or revoked sooner. Performed at [facility], [place] [address] [place], [place] [address]     Discharge Instructions     Discharge Instructions     Diet general   Complete by: As directed    Increase activity slowly   Complete by: As directed       Allergies as of 07/12/2019       Reactions   Amoxicillin Sodium Rash   Full body rash   Penicillins Rash   amoxicillins         Medication List     STOP taking these medications    bisacodyl 5 MG EC tablet Commonly known as: DULCOLAX Replaced by: bisacodyl 10 MG suppository   docusate sodium 100 MG capsule Commonly known as: Colace   pantoprazole 40 MG tablet Commonly known as: PROTONIX   polyethylene glycol powder 17 GM/SCOOP powder Commonly known as: GLYCOLAX/MIRALAX Replaced by: polyethylene glycol 17 g packet       TAKE these medications    bisacodyl 10 MG suppository Commonly known as: DULCOLAX Place 1 suppository (10 mg total) rectally daily as needed for severe constipation. Replaces: bisacodyl 5 MG EC tablet   linaclotide 290 MCG Caps capsule Commonly known as: Linzess Take 1 capsule (290 mcg total) by mouth daily before breakfast.   oxyCODONE 5 MG immediate release tablet Commonly known as: Oxy IR/ROXICODONE Take 1 tablet (5 mg total) by mouth every 4 (four) hours as needed for severe pain.   polyethylene glycol 17 g packet Commonly known as: MiraLax Take 17 g by mouth daily. Replaces: polyethylene glycol powder 17 GM/SCOOP powder    senna-docusate 8.6-50 MG tablet Commonly known as: Senokot-S Take 1 tablet by mouth 2 (two) times daily  as needed for mild constipation or moderate constipation.   traMADol 50 MG tablet Commonly known as: ULTRAM Take 1 tablet (50 mg total) by mouth every 6 (six) hours as needed for moderate pain or severe pain.         Allergies  Allergen Reactions   Amoxicillin Sodium Rash    Full body rash   Penicillins Rash    amoxicillins     Dispo: The patient is from: Home              Anticipated d/c is to:  Home with hospice              Anticipated d/c date is:Today                  Time coordinating discharge: Over 30 minutes   SIGNED:   Harold Hedge, D.O. [facility] Pager: [phone]  07/12/2019, 11:28 AM

## 2019-07-12 NOTE — Plan of Care (Signed)
  Problem: Education: ?Goal: Knowledge of General Education information will improve ?Description: Including pain rating scale, medication(s)/side effects and non-pharmacologic comfort measures ?Outcome: Progressing ?  ?Problem: Health Behavior/Discharge Planning: ?Goal: Ability to manage health-related needs will improve ?Outcome: Progressing ?  ?Problem: Clinical Measurements: ?Goal: Ability to maintain clinical measurements within normal limits will improve ?Outcome: Progressing ?Goal: Will remain free from infection ?Outcome: Progressing ?Goal: Diagnostic test results will improve ?Outcome: Progressing ?Goal: Respiratory complications will improve ?Outcome: Progressing ?  ?Problem: Activity: ?Goal: Risk for activity intolerance will decrease ?Outcome: Progressing ?  ?Problem: Coping: ?Goal: Level of anxiety will decrease ?Outcome: Progressing ?  ?Problem: Elimination: ?Goal: Will not experience complications related to bowel motility ?Outcome: Progressing ?Goal: Will not experience complications related to urinary retention ?Outcome: Progressing ?  ?Problem: Pain Managment: ?Goal: General experience of comfort will improve ?Outcome: Progressing ?  ?Problem: Safety: ?Goal: Ability to remain free from injury will improve ?Outcome: Progressing ?  ?Problem: Skin Integrity: ?Goal: Risk for impaired skin integrity will decrease ?Outcome: Progressing ?  ?

## 2019-07-27 DEATH — deceased

## 2022-03-14 IMAGING — US US BIOPSY CORE LIVER
1 series · 10 of 10 positions shown · non-contrast
Comparison: none

INDICATION: 85-year-old male with multifocal hepatic lesions concerning for
metastatic disease. He does have a history of prostate cancer. He
presents for ultrasound-guided biopsy of a liver lesion.

[Series 1: us biopsy (liver) · 10 of 10 slices shown]
[im 1/10]
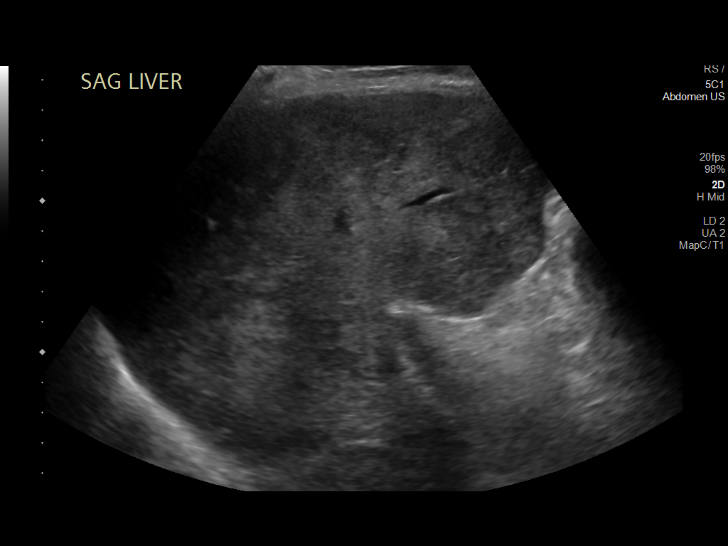
[im 2/10]
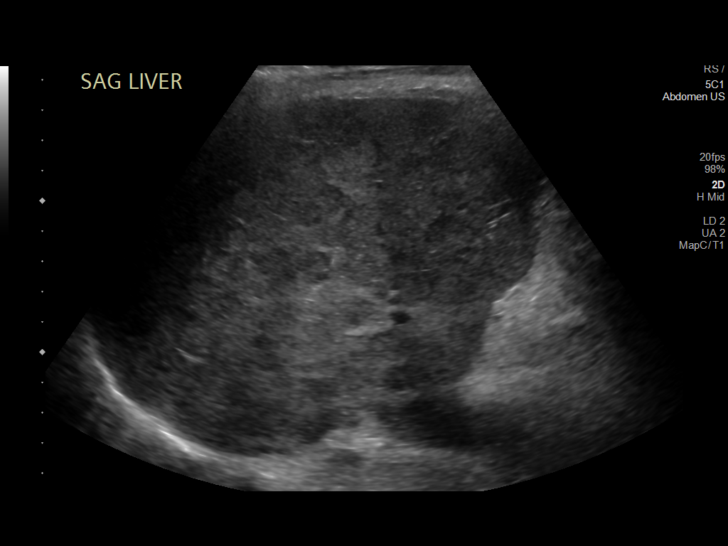
[im 3/10]
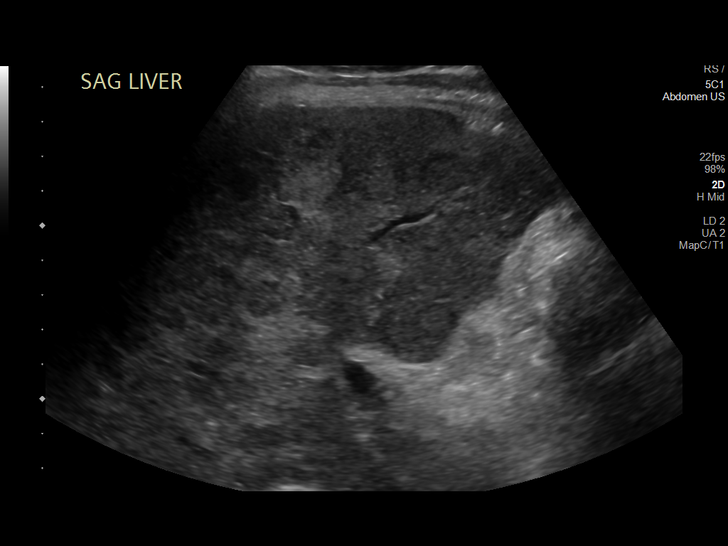
[im 4/10]
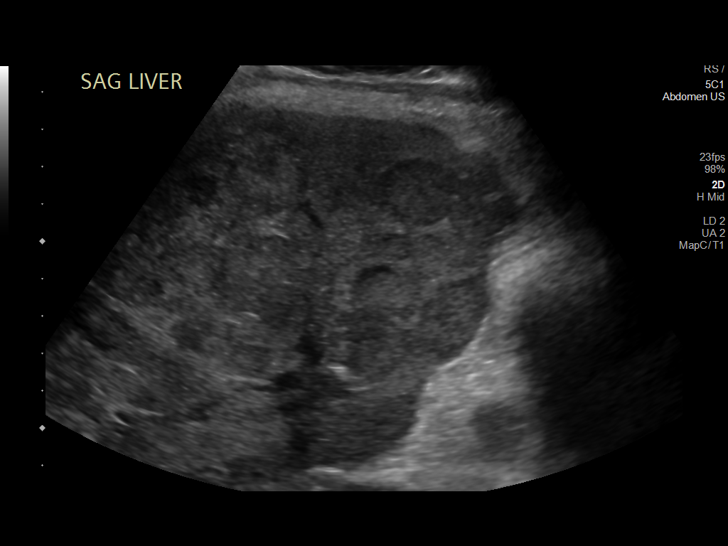
[im 5/10]
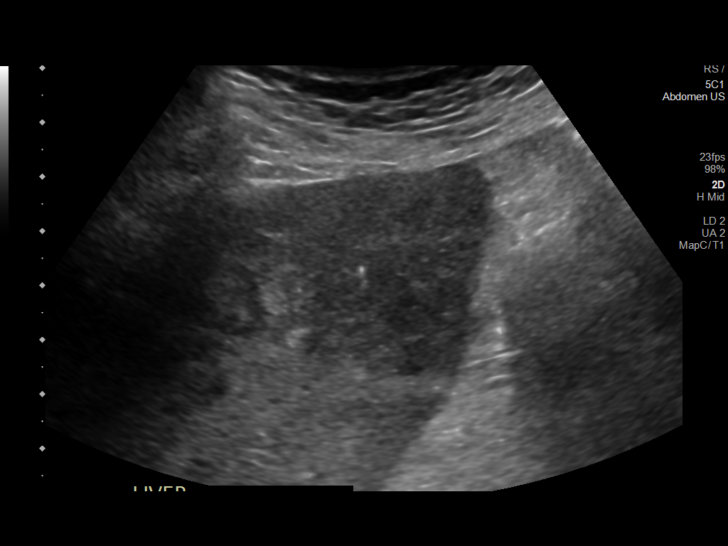
[im 6/10]
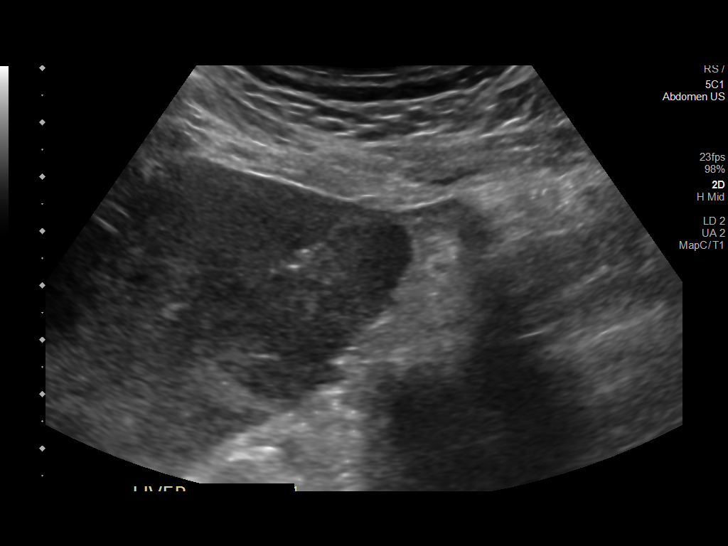
[im 7/10]
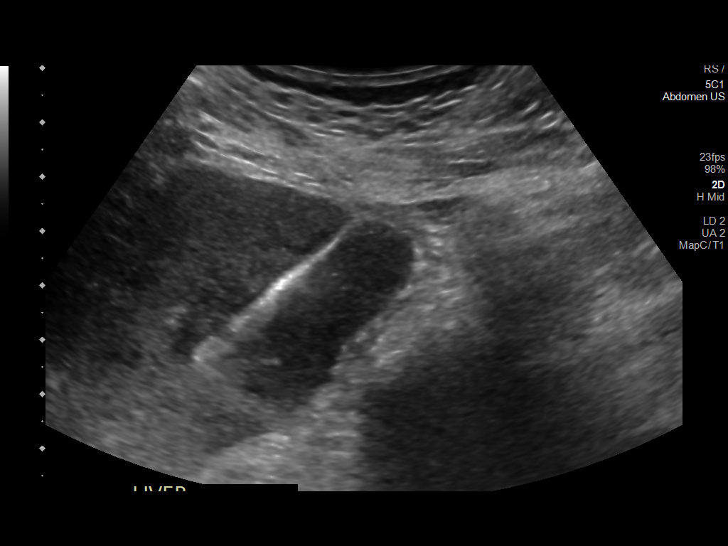
[im 8/10]
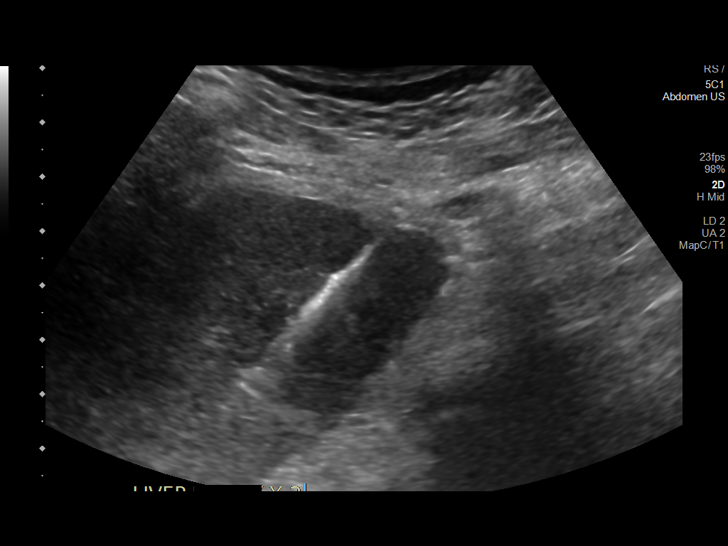
[im 9/10]
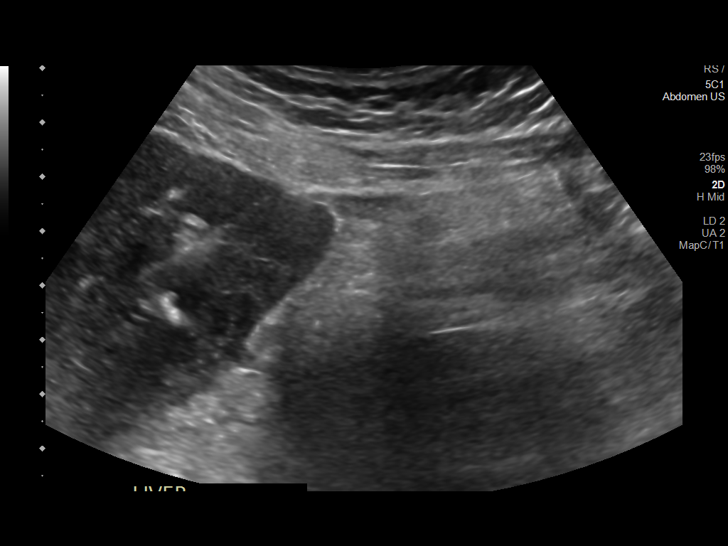
[im 10/10]
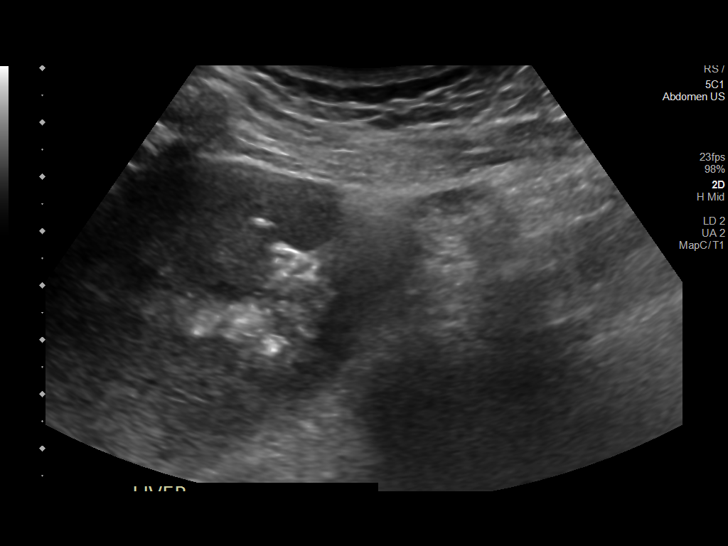

[10 of 10 positions shown; findings below may reference images not displayed]

EXAM:
ULTRASOUND BIOPSY CORE LIVER

MEDICATIONS:
None.

ANESTHESIA/SEDATION:
Moderate (conscious) sedation was employed during this procedure. A
total of Versed 1 mg and Fentanyl 50 mcg was administered
intravenously.

Moderate Sedation Time: 11 minutes. The patient's level of
consciousness and vital signs were monitored continuously by
radiology nursing throughout the procedure under my direct
supervision.

FLUOROSCOPY TIME:  None.

COMPLICATIONS:
None immediate.

PROCEDURE:
Informed written consent was obtained from the patient after a
thorough discussion of the procedural risks, benefits and
alternatives. All questions were addressed. A timeout was performed
prior to the initiation of the procedure.

Ultrasound was used to interrogate the liver. There are multiple
subtly hypoechoic solid lesions scattered throughout the liver. A
suitable lesion was identified and a skin entry site selected and
marked. The skin was sterilely prepped and draped in the standard
fashion using chlorhexidine skin prep. Local anesthesia was attained
by infiltration with 1% lidocaine. A small dermatotomy was made.

Under real-time ultrasound guidance, a 17 gauge introducer needle
was advanced through the liver and positioned at the margin of the
lesion. Multiple 18 gauge core biopsies were coaxially obtained
using the BioPince automated biopsy device. Biopsy specimens were
placed in formalin and delivered to pathology for further analysis.

As the introducer needle was removed, the biopsy tract was embolized
with a Gel-Foam slurry.
IMPRESSION: Technically successful ultrasound-guided core biopsy of liver
lesion.

## 2022-03-22 IMAGING — CT CT ABD-PELV W/O CM
2 of 4 series · 15 of 46 positions shown, 17 images · non-contrast
Comparison: MRI 06/29/2019, CT 06/28/2019

CLINICAL DATA: Abdominal distension, recent diagnosis of liver
cancer

EXAM:
CT ABDOMEN AND PELVIS WITHOUT CONTRAST
TECHNIQUE: Multidetector CT imaging of the abdomen and pelvis was performed
following the standard protocol without IV contrast.

[Series 3: a/p w/o 5mm · axial · non-contrast · 0.96mm/px · z∈[+706,+1211]mm · 12 of 113 slices shown, 14 images]
[im 6/113  soft-tissue]
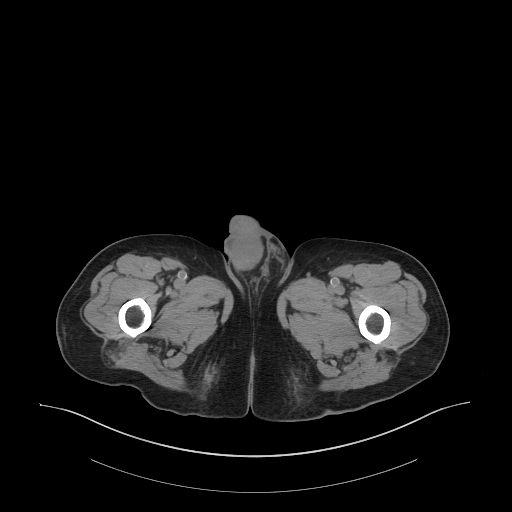
[im 6/113  bone]
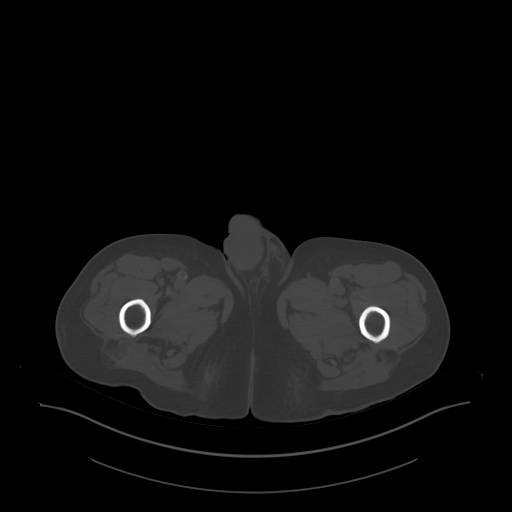
[im 16/113  soft-tissue]
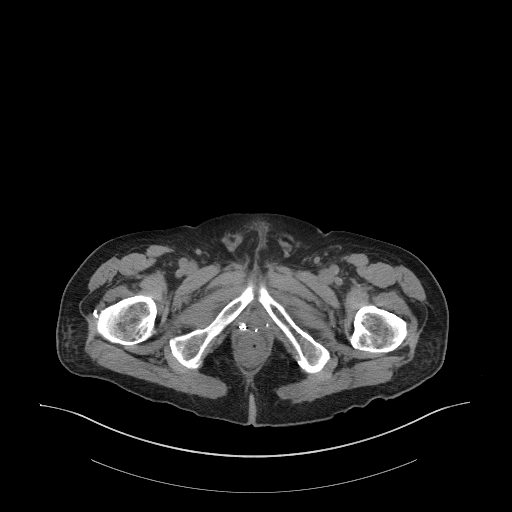
[im 26/113  soft-tissue]
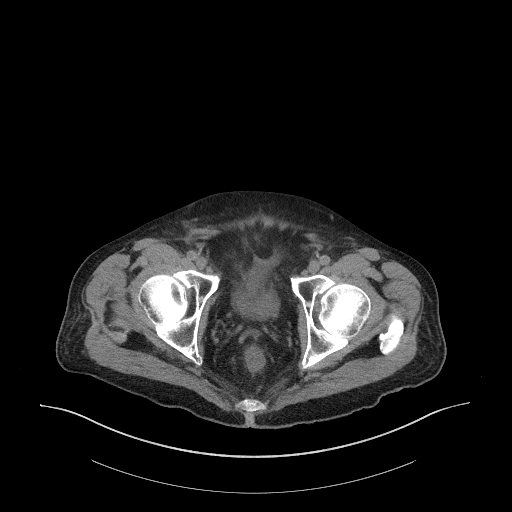
[im 36/113  soft-tissue]
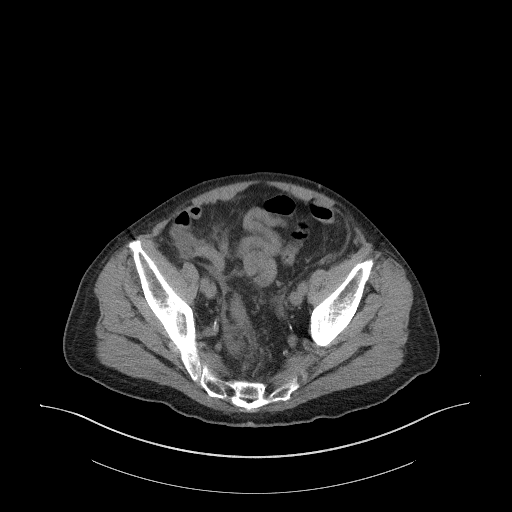
[im 41/113  soft-tissue]
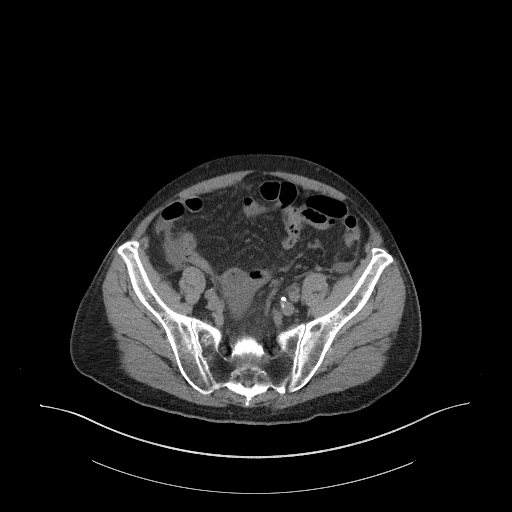
[im 51/113  soft-tissue]
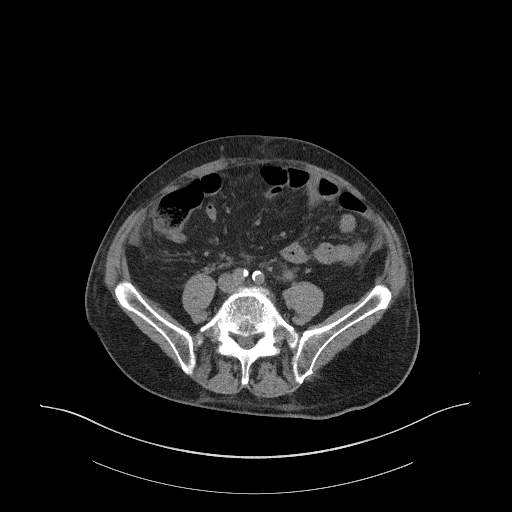
[im 62/113  soft-tissue]
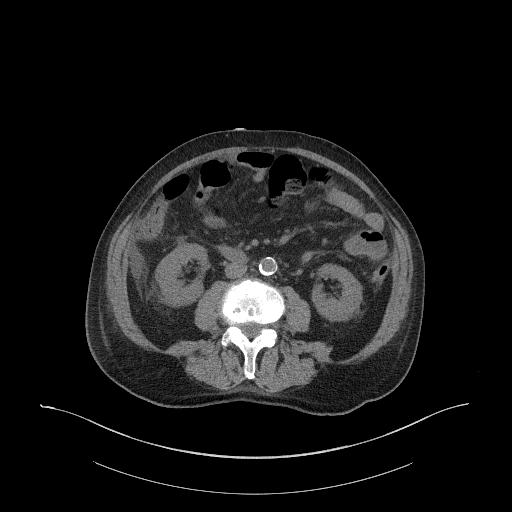
[im 72/113  soft-tissue]
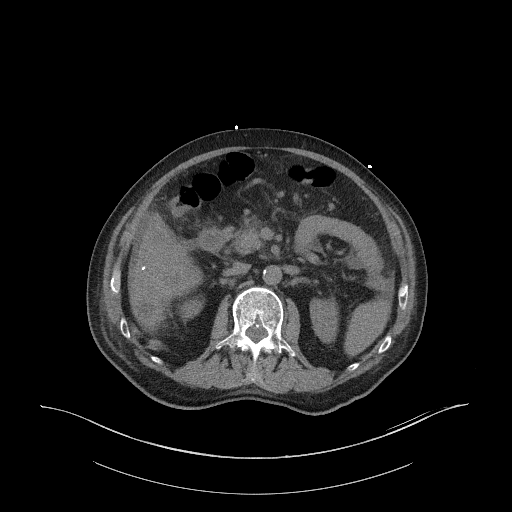
[im 77/113  soft-tissue]
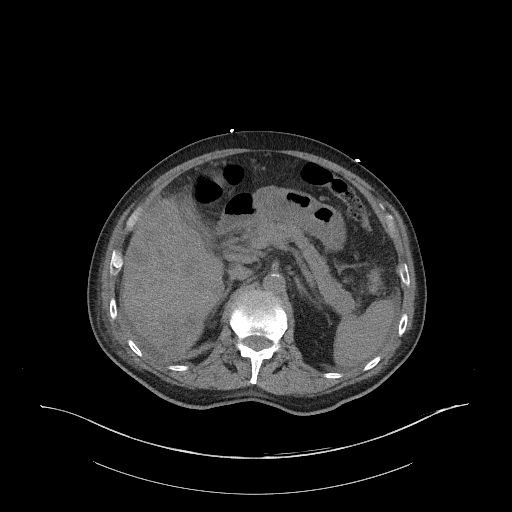
[im 77/113  bone]
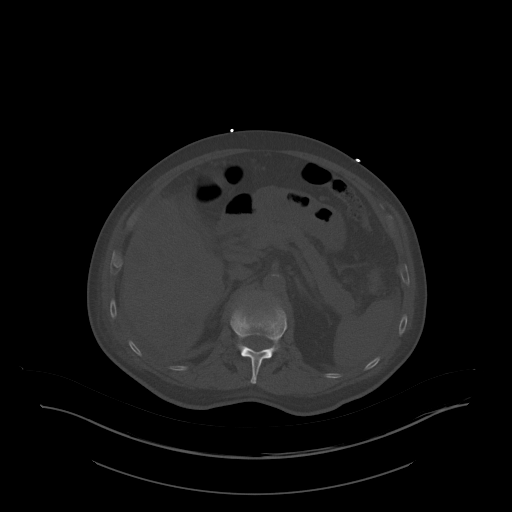
[im 87/113  soft-tissue]
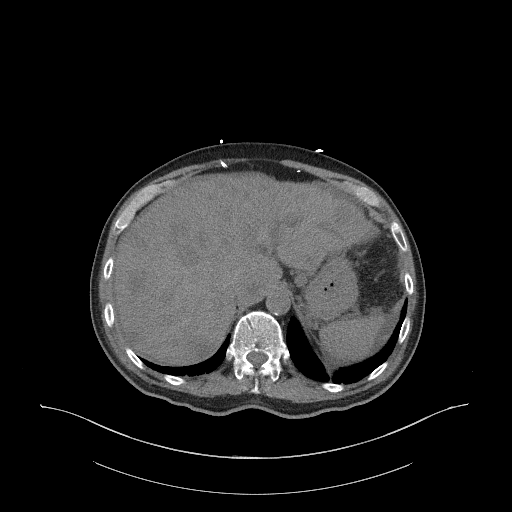
[im 97/113  soft-tissue]
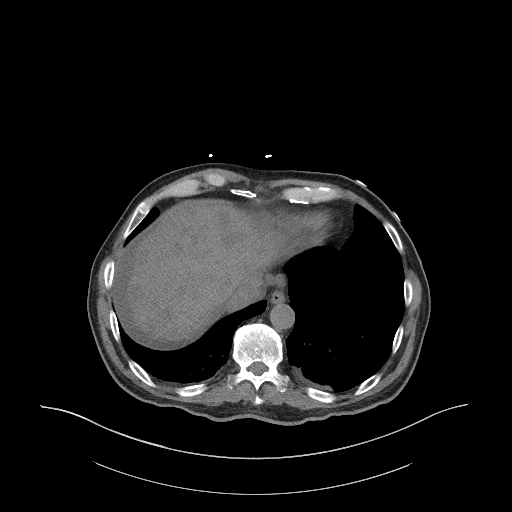
[im 107/113  soft-tissue]
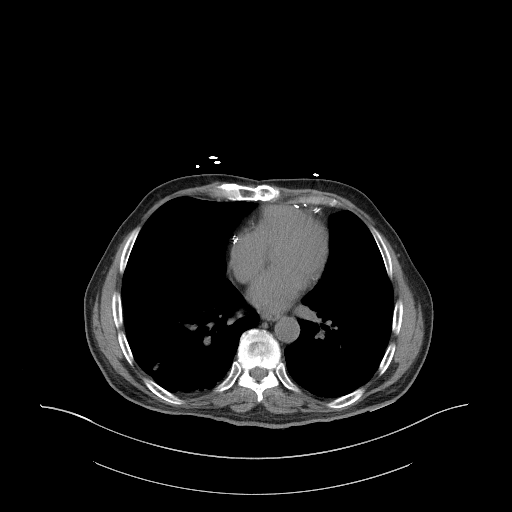

[Series 6: a/p w/o cor · coronal · non-contrast · 1.10mm/px · 3 of 173 slices shown]
[im 58/173  soft-tissue]
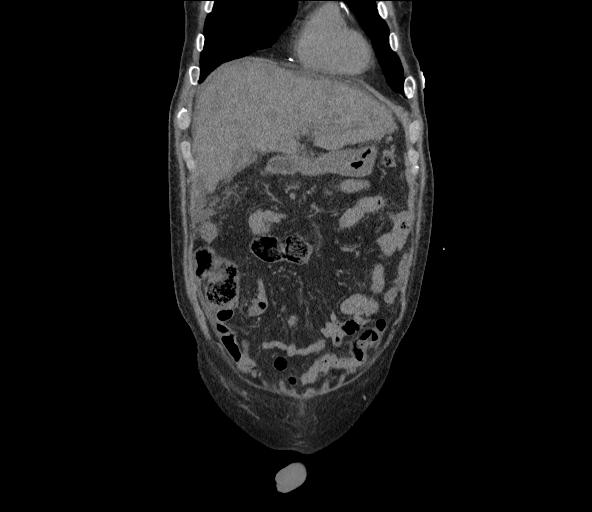
[im 77/173  soft-tissue]
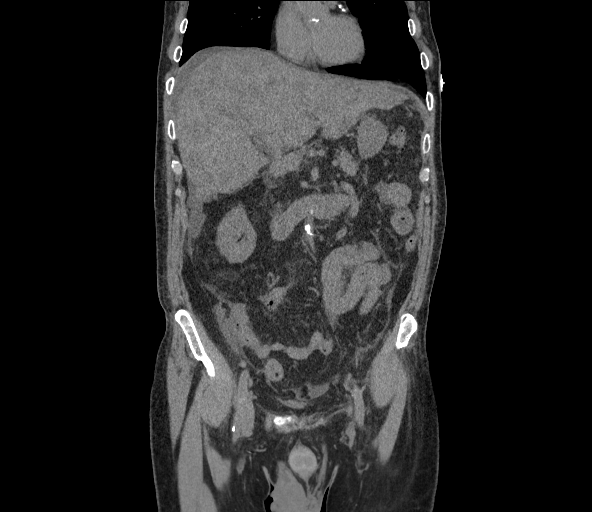
[im 96/173  soft-tissue]
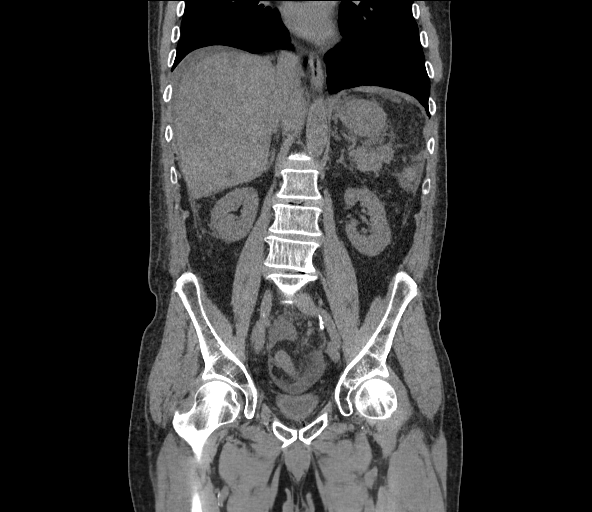

[15 of 46 positions shown; findings below may reference images not displayed]

FINDINGS: Lower chest: Dependent atelectatic changes seen in the lung bases.
No visible effusion or consolidation. Normal cardiac size.
Postsurgical changes from prior CABG with abandoned epicardial pacer
wires and surgical clips in the mediastinum with calcification of
the native coronary arteries.

Hepatobiliary: Redemonstration of the innumerable hypoattenuating
lesions throughout both lobes of the liver. Overall disease burden
is grossly similar to comparison study in the absence of contrast
media. New small volume perihepatic ascites is noted. Gallbladder is
decompressed. Mild wall thickening is nonspecific given
underdistention. No visible calcified gallstones or biliary ductal
dilatation.

Pancreas: Redemonstration of a 3.5 cm hypoattenuating lesion at the
tail of the pancreas. Remaining portions of the pancreas are
unremarkable without peripancreatic inflammation or ductal
dilatation.

Spleen: Normal in size without focal abnormality. Small volume
perisplenic ascites.

Adrenals/Urinary Tract: Normal adrenal glands. Mild symmetric
bilateral perinephric stranding is similar to prior. No suspicious
renal lesion, urolithiasis or hydronephrosis. Bladder is
decompressed. Mild wall thickening likely related to
underdistention.

Stomach/Bowel: Distal esophagus, stomach are unremarkable. Mild
thickening of the duodenum and small bowel possibly related to the
presence of edematous fluid in the upper abdomen. No significant
small bowel dilatation or evidence of mechanical obstruction. The
appendix is surgically absent. No colonic dilatation or wall
thickening.

Vascular/Lymphatic: Atherosclerotic calcifications throughout the
abdominal aorta and branch vessels. No aneurysm or ectasia. No
enlarged abdominopelvic lymph nodes.

Reproductive: Multiple prostate brachytherapy implants are noted.
Seminal vesicles are unremarkable.

Other: Small volume ascites in the upper abdomen, as detailed above.
Additional small volume simple attenuation fluid in the low abdomen
and pelvis. No abdominopelvic free air. Postsurgical changes in the
right groin may reflect prior vascular access or inguinal hernia
repair. Mild body wall edema.

Musculoskeletal: The osseous structures appear diffusely
demineralized which may limit detection of small or nondisplaced
fractures. Multilevel degenerative changes are present in the imaged
portions of the spine. No acute osseous abnormality or suspicious
osseous lesion.
IMPRESSION: 1. Redemonstration of the innumerable hypoattenuating lesions
throughout both lobes of the liver, consistent with metastatic
disease. Overall disease burden is grossly similar to comparison
study.
2. Interval development of small volume ascites throughout the
abdomen and pelvis may reflect hepatic dysfunction secondary to the
metastatic burden above.
3. Redemonstration of a 3.5 cm hypoattenuating lesion at the tail of
the pancreas, also suspicious for primary malignancy or metastasis
though patient's recent liver biopsy results demonstrated a
urothelial origin.
4. Mild thickening of the duodenum and small bowel possibly related
to the presence of edematous fluid in the upper abdomen. Correlate
for upper GI symptoms.
5. Mild body wall edema.
6. Aortic Atherosclerosis ([id number]).
